# Patient Record
Sex: Female | Born: 1975 | Race: Black or African American | Hispanic: No | Marital: Married
Health system: Midwestern US, Community
[De-identification: ages and names within clinical notes are randomized; demographics above are authoritative.]

## PROBLEM LIST (undated history)

## (undated) DIAGNOSIS — M7061 Trochanteric bursitis, right hip: Secondary | ICD-10-CM

## (undated) DIAGNOSIS — L409 Psoriasis, unspecified: Secondary | ICD-10-CM

## (undated) DIAGNOSIS — E663 Overweight: Secondary | ICD-10-CM

## (undated) DIAGNOSIS — R635 Abnormal weight gain: Secondary | ICD-10-CM

---

## 2013-01-29 NOTE — Progress Notes (Signed)
"  Reviewed record in preparation for visit and have obtained the necessary documentation"  Chief Complaint   Patient presents with   ??? New Patient   ??? PPD Placement   ??? Fatigue       Patient presents in the office today as a new patient establishing care with a new PCP  Patient has past medical hx of depression,psoriosis,and family hx of heart disease and hypertension  Patient recently lost her brother and is having issues with depression  Patient also request PPD placement for dermatologist to continue administering Stelara injections  Patient also has complaint so fatigue and rapid weight gain the past 6 months  Patient given PPD injection in L forearm.  Patient advised to return in 48-72 hours to have PPD read.  PHQ 2 / 9, over the last two weeks 01/29/2013   Little interest or pleasure in doing things Nearly every day   Feeling down, depressed or hopeless Nearly every day   Total Score PHQ 2 6

## 2013-01-29 NOTE — Patient Instructions (Signed)
Adjustment Disorder: After Your Visit  Your Care Instructions  Adjustment disorder means that you have emotional or behavioral problems because of stress. But your response to the stress is far more severe than a normal response. It is severe enough to affect your work or social life and may cause depression and physical pains and problems. Events that may cause this response can include a divorce, money problems, or starting school or a new job. It might be anything that causes some stress.  This disorder is most often a short-term problem. It happens within 3 months of the stressful event or change. If the response lasts longer than 6 months after the event ends, you may have a more serious disorder.  Follow-up care is a key part of your treatment and safety. Be sure to make and go to all appointments, and call your doctor if you are having problems. It's also a good idea to know your test results and keep a list of the medicines you take.  How can you care for yourself at home?  ?? Go to all counseling sessions. Do not skip any because you are feeling better.  ?? If your doctor prescribed medicines, take them exactly as prescribed. Call your doctor if you think you are having a problem with your medicine. You will get more details on the specific medicines your doctor prescribes.  ?? Discuss the causes of your stress with a good friend or family member. Or you can join a support group for people with similar problems. Talking to others sometimes relieves stress.  ?? Get at least 30 minutes of exercise on most days of the week. Walking is a good choice. You also may want to do other activities, such as running, swimming, cycling, or playing tennis or team sports.  Relaxation techniques  Do relaxation exercises 10 to 20 minutes a day. You can play soothing, relaxing music while you do them, if you wish.  ?? Tell others in your house that you are going to do your relaxation exercises. Ask them not to disturb you.  ??  Find a comfortable, quiet place.  ?? Lie down on your back, or sit with your back straight.  ?? Focus on your breathing. Make it slow and steady.  ?? Breathe in through your nose. Breathe out through either your nose or mouth.  ?? Breathe deeply, filling up the area between your navel and your rib cage. Breathe so that your belly goes up and down.  ?? Do not hold your breath.  ?? Breathe like this for 5 to 10 minutes. Notice the feeling of calmness throughout your whole body.  As you continue to breathe slowly and deeply, relax by doing these next steps for another 5 to 10 minutes:  ?? Tighten and relax each muscle group in your body. Start at your toes, and work your way up to your head.  ?? Imagine your muscle groups relaxing and getting heavy.  ?? Empty your mind of all thoughts.  ?? Let yourself relax more and more deeply.  ?? Be aware of the state of calmness that surrounds you.  ?? When your relaxation time is over, you can bring yourself back to alertness by moving your fingers and toes. Then move your hands and feet. And then move your entire body. Sometimes people fall asleep during relaxation. But they most often wake up soon.  ?? Always give yourself time to return to full alertness before you drive a car. Wait to do   anything that might cause an accident if you are not fully alert. Never play a relaxation tape while you drive a car.  When should you call for help?  Call 911 anytime you think you may need emergency care. For example, call if:  ?? You feel you cannot stop from hurting yourself or someone else.  Watch closely for changes in your health, and be sure to contact your doctor if:  ?? You can't go to your counseling sessions.  ?? You do not get better as expected.   Where can you learn more?   Go to http://www.healthwise.net/[facility]  Enter R087 in the search box to learn more about "Adjustment Disorder: After Your Visit."   ?? 2006-2014 Healthwise, Incorporated. Care instructions adapted under license by   [employer] (which disclaims liability or warranty for this information). This care instruction is for use with your licensed healthcare professional. If you have questions about a medical condition or this instruction, always ask your healthcare professional. Healthwise, Incorporated disclaims any warranty or liability for your use of this information.  Content Version: 10.2.346038; Current as of: May 24, 2011

## 2013-01-31 LAB — AMB POC TUBERCULOSIS, INTRADERMAL (SKIN TEST)
PPD: NEGATIVE Negative
mm Induration: 0 mm

## 2013-01-31 NOTE — Telephone Encounter (Signed)
This was done.  PPD neg

## 2013-01-31 NOTE — Telephone Encounter (Signed)
Christine HerrlichFaye from Dr.Phieffer(Dermetology) Office called and will like to receive pt TB results by fax [phone]/ Office # [phone]

## 2013-01-31 NOTE — Addendum Note (Signed)
Addended by: Anise SalvoBRACEY, Wofford Stratton M on: 01/31/2013 10:32 AM     Modules accepted: Orders

## 2013-01-31 NOTE — Progress Notes (Signed)
Subjective:   38 y.o. female for annual physical exam.     Last PAP she thinks was 2 years ago, no history of abnormal and mammogram is due in Oct 2015, no family history of breast cancer.     Her gyne and breast care is done elsewhere by her Ob-Gyne physician but she would like to transfer her care here.  We will need [facility] records to review and determine when next are due.      Specific concerns today:    Patient presents in the office today as a new patient establishing care with a new PCP     Patient has past medical hx of depression,psoriosis,and family hx of heart disease and hypertension   -Patient also request PPD placement for dermatologist to continue administering Stelara injections   -sees derm for psoriasis and needs TB testing today given recommended at start and intermittent while on stelera; will send results to derm; Dr. Freada BergeronLaura Phieffer, MD @ [phone] (fax), she is at [facility] at [phone]    Patient recently lost her brother and is having issues with depression in the last year.    -he died in sleep in June 2014, still having sudden outburst of crying, has not seen counselor. No medication for this.  No history of depression in past  -cousin died from drive by shooting while on vacation, she was from [place]  -multiple family members have died in last 3 years      Patient also has complaint so fatigue and rapid weight gain the past 6 months; she has lost interest in things and no desire to get out and walk, exercise. Correlated to death of brother.           There is no problem list on file for this patient.    There are no active problems to display for this patient.    Current Outpatient Prescriptions   Medication Sig Dispense Refill   ??? fluocinoNIDE (LIDEX) 0.05 % ointment Apply 0.05 Squirts to affected area two (2) times daily as needed.       ??? Ustekinumab (USTEKINAUMAB) 90 mg/mL injection by SubCUTAneous route once.       ??? omeprazole (PRILOSEC) 10 mg capsule Take 10 mg by  mouth daily.       ??? CALCIUM CARBONATE/MAG HYDROX (ROLAIDS PO) Take  by mouth.       ??? calcium carbonate (TUMS) 200 mg calcium (500 mg) chew Take 1 tablet by mouth daily.       ??? levonorgestrel (MIRENA) 20 mcg/24 hr (5 years) IUD 1 each by IntraUTERine route once.         Allergies   Allergen Reactions   ??? Bactrim [Sulfamethoprim Ds] Hives     Past Medical History   Diagnosis Date   ??? Antiphospholipid syndrome    ??? GERD (gastroesophageal reflux disease)    ??? Acid reflux    ??? Contact dermatitis and other eczema, due to unspecified cause    ??? Psoriasis      Past Surgical History   Procedure Laterality Date   ??? Hx wisdom teeth extraction Bilateral    ??? Extraction erupted tooth/exr       Family History   Problem Relation Age of Onset   ??? Heart Disease Mother    ??? Heart Disease Father    ??? Heart Disease Maternal Grandmother    ??? Hypertension Maternal Grandmother    ??? Heart Disease Maternal Grandfather    ??? Hypertension Maternal  Grandfather    ??? Heart Disease Paternal Grandmother    ??? Hypertension Paternal Grandmother    ??? Heart Disease Paternal Grandfather    ??? Diabetes Paternal Grandfather    ??? Hypertension Paternal Grandfather      History   Substance Use Topics   ??? Smoking status: Never Smoker    ??? Smokeless tobacco: Never Used   ??? Alcohol Use: No        No results found for this basename: WBC, WBCLT, [id number], HGBI, HGBPOC, IHGB, HGB, HGBP, WBHGB, HCTI, HCTPOC, IHCT, HCT, PHCT, RBCH, WBHCT, PLT, [id number], [id number], MCV  No results found for this basename: HBA1C, HGBE8, GTF, GLU, GF, GESTF, GTB, GLUCPOC, MCACR, MCA1, MCA2, MCA3, MCA4, UMCA, MCAU, MCARR, LDL, DLDL, LDLC, DLDLP, CRES, CREAI, CREAPOC, ICREA, MCREA, REFC7, ACREA, CREA, REFC3, REFC4   No results found for this basename: CHOL, [id number], CHOLX, CHOLP, CHLST, CHOLV, HDL, [id number], HDLC, HDLP, LDL, DLDL, LDLC, DLDLP, TGL, TGLX, TRIGL, [id number], TRIGP, CHHD, CHHDX  No results found for this basename: GPT, ALT, SGOT, GGT, GGTP, AP, APIT, APX, CBIL, TBIL,  TBILI, [id number]  No results found for this basename: INR, INRI, INRMR, PTMR, PTP, PT1, PT2, INRPOC      No results found for this basename: CGFR, GFRAA, GFRNA, CRCLT, [id number], [id number], CCT, CRES, CREAI, CREAPOC, ICREA, MCREA, REFC7, ACREA, CREA, REFC3, REFC4, BUN, BUNIS, BUNPOC, IBUN, MBUNV, BUNV, INA, NAPOC, NAI, NA, PNA, WBNA, K, IK, KPOCT, KI, PLK, WBK, CLI, CLPOC, ICL, PCL, CL, WBCL, CO2, DIGF, DIG, DIGP, MDIG   No results found for this basename: TSH, TSH2, TSH3, TSHP, TSHELE, T3RIA, TT3, T3U, T3UP, FRT3, FT3, FT4, FT4P, T4, T4P, FT4T, 1164, TT7, TMCAB   No results found for this basename: GLU, GLUCPOC, GLPOC             Review of Systems  A comprehensive review of systems was negative except for that written in the HPI.    Objective:   Blood pressure 117/80, pulse 69, temperature 98.5 ??F (36.9 ??C), temperature source Oral, resp. rate 18, height 5' 1.25" (1.556 m), weight 172 lb 6.4 oz (78.2 kg), SpO2 99.00%.   Physical Examination:   General appearance - alert, well appearing, and in no distress  Eyes - pupils equal and reactive, extraocular eye movements intact  Neck - supple, no significant adenopathy, thyroid exam: thyroid is normal in size without nodules or tenderness  Chest - clear to auscultation, no wheezes, rales or rhonchi, symmetric air entry  Heart - normal rate, regular rhythm, normal S1, S2, no murmurs, rubs, clicks or gallops  Neurological - alert, oriented, normal speech, no focal findings or movement disorder noted; tearful at times.    Assessment/Plan:   Well woman AND additional issues including fatigue which may be depression due to recent death, will rule out other etiology and then if normal will consider SSRI for short time and counseling.       increase physical activity      ICD-9-CM    1. Well adult exam V70.0 CBC WITH AUTOMATED DIFF     LIPID PANEL     METABOLIC PANEL, COMPREHENSIVE     HEMOGLOBIN A1C   2. Medication management V58.69 AMB POC TUBERCULOSIS, INTRADERMAL (SKIN  TEST)   3. Fatigue; DDX: endocrine, metabolic or depression   780.79 TSH, 3RD GENERATION     F/U based on labs    Get [facility] records  Dr. Orpah Melter,  D.O.  Physician

## 2013-02-03 LAB — METABOLIC PANEL, COMPREHENSIVE
A-G Ratio: 1.6 (ref 1.1–2.5)
ALT (SGPT): 10 IU/L (ref 0–32)
AST (SGOT): 16 IU/L (ref 0–40)
Albumin: 4.2 g/dL (ref 3.5–5.5)
Alk. phosphatase: 45 IU/L (ref 39–117)
BUN/Creatinine ratio: 13 (ref 8–20)
BUN: 10 mg/dL (ref 6–20)
Bilirubin, total: 0.3 mg/dL (ref 0.0–1.2)
CO2: 22 mmol/L (ref 18–29)
Calcium: 9.3 mg/dL (ref 8.7–10.2)
Chloride: 100 mmol/L (ref 97–108)
Creatinine: 0.78 mg/dL (ref 0.57–1.00)
GFR est AA: 111 mL/min/{1.73_m2} (ref >59)
GFR est non-AA: 96 mL/min/{1.73_m2} (ref >59)
GLOBULIN, TOTAL: 2.6 g/dL (ref 1.5–4.5)
Glucose: 86 mg/dL (ref 65–99)
Potassium: 4.7 mmol/L (ref 3.5–5.2)
Protein, total: 6.8 g/dL (ref 6.0–8.5)
Sodium: 137 mmol/L (ref 134–144)

## 2013-02-03 LAB — LIPID PANEL
Cholesterol, total: 186 mg/dL (ref 100–199)
HDL Cholesterol: 56 mg/dL (ref >39)
LDL, calculated: 116 mg/dL — ABNORMAL HIGH (ref 0–99)
Triglyceride: 68 mg/dL (ref 0–149)
VLDL, calculated: 14 mg/dL (ref 5–40)

## 2013-02-03 LAB — CBC WITH AUTOMATED DIFF
ABS. BASOPHILS: 0 10*3/uL (ref 0.0–0.2)
ABS. EOSINOPHILS: 0.2 10*3/uL (ref 0.0–0.4)
ABS. IMM. GRANS.: 0 10*3/uL (ref 0.0–0.1)
ABS. MONOCYTES: 0.6 10*3/uL (ref 0.1–0.9)
ABS. NEUTROPHILS: 4.3 10*3/uL (ref 1.4–7.0)
Abs Lymphocytes: 2.5 10*3/uL (ref 0.7–3.1)
BASOPHILS: 1 %
EOSINOPHILS: 3 %
HCT: 42.2 % (ref 34.0–46.6)
HGB: 14.1 g/dL (ref 11.1–15.9)
IMMATURE GRANULOCYTES: 0 %
Lymphocytes: 32 %
MCH: 30.5 pg (ref 26.6–33.0)
MCHC: 33.4 g/dL (ref 31.5–35.7)
MCV: 91 fL (ref 79–97)
MONOCYTES: 8 %
NEUTROPHILS: 56 %
PLATELET: 334 10*3/uL (ref 150–379)
RBC: 4.62 x10E6/uL (ref 3.77–5.28)
RDW: 13 % (ref 12.3–15.4)
WBC: 7.7 10*3/uL (ref 3.4–10.8)

## 2013-02-03 LAB — HEMOGLOBIN A1C WITH EAG: Hemoglobin A1c: 5.8 % — ABNORMAL HIGH (ref 4.8–5.6)

## 2013-02-03 LAB — TSH 3RD GENERATION: TSH: 0.879 u[IU]/mL (ref 0.450–4.500)

## 2013-02-03 LAB — CVD REPORT: PDF IMAGE: 0

## 2013-02-04 NOTE — Telephone Encounter (Signed)
Saye from Dr.Phier office will like to receive pt PPD test results by fax, please fax to [phone], please fax document asap.  Lanier EnsignSaye can be reached at [phone]

## 2013-02-05 NOTE — Telephone Encounter (Signed)
Faxed labs and PPD reading to MD

## 2013-02-20 NOTE — Progress Notes (Signed)
Outbound call made to patient to schedule prediabetic education per Dr. Tawni PummelVarney's request. Unable to reach; left voicemail to return NN's phone call.    Patient returning NN's phone call--scheduled NN appointment on 02/21/13 at 4:00pm.

## 2013-02-21 NOTE — Progress Notes (Signed)
NN appointment today, 02/21/13, for prediabetic education.    NN instructed patient on prediabetes disease process and pathophysiology. NN instructed patient on the significance of HgbA1C lab test and the importance of having it drawn every 3-6 months. Patient's most recent HgbA1C 5.8%. Patient is aware that HgbA1C progressing to 6.5% would be considered diabetes.    NN reviewed diabetic plate method with patient. NN reviewed that half of the plate should consist of non-starchy vegetables, while the other half of the plate should be divided into two quarters--one quarter starch and one quarter protein. NN reviewed examples of starchy vegetables (corn, potatoes, lima beans, sweet potatoes, acorn squash, green peas). NN reviewed appropriate portion sizes, such as a lean source of protein should be about the size of a deck of cards. NN instructed patient on diabetic meal plans and food suggestions for breakfast, lunch, & dinner. NN instructed patient on the importance of eating 3 meals a day with 1 snack, and spacing these meals out every 4 to 5 hours for best glycemic control. NN reviewed ideas of good diabetic snacks such as 6 saltine crackers with cheese, handful of almonds, small yogurt, or string cheese & small apple. NN provided patient with diabetic plate method handouts. NN suggested patient "meal prep" one day a week for the entire week, to include measuring portion sizes and preparing meals in individual tupperware containers for the week. Patient voiced understanding and found this to be helpful with her work schedule.    NN instructed patient on the importance of incorporating exercise into her lifestyle. NN suggested patient walk 2-3 times per week for 30 minutes, and increase in frequency and duration as tolerated. Patient reports she had a treadmill at home; she states she will try to start exercising in the morning prior to work as she is often too tired at the end of the day.     NN provided patient  with contact information for any further questions or concerns. Reviewed plan of care. Patient has provided input to plan and agrees with goals.

## 2013-03-21 MED ORDER — PHENTERMINE 37.5 MG TAB
37.5 mg | ORAL_TABLET | ORAL | Status: DC
Start: 2013-03-21 — End: 2013-04-17

## 2013-03-21 NOTE — Patient Instructions (Signed)
Phentermine (By mouth)   Phentermine (FEN-ter-meen)  Helps you lose weight when used for a short time.   Brand Name(s):Adipex-P, Phentercot   There may be other brand names for this medicine.  When This Medicine Should Not Be Used:   Do not use this medicine if you have had an allergic reaction to phentermine or similar medicines (such as adrenaline, amphetamine, dopamine, dobutamine, ephedrine, or lisdexamfetamine). Do not use this medicine if you are pregnant or breastfeeding. Do not use this medicine if you are normally nervous and tense or if you have glaucoma, an overactive thyroid, heart or blood vessel disease, heart rhythm problems, uncontrolled high blood pressure, or a history of stroke or drug abuse. Do not use this medicine if you also take an MAO inhibitor (MAOI), such as isocarboxazid, phenelzine, selegiline, tranylcypromine, Eldepryl??, Marplan??, Nardil??, or Parnate??, or if you have taken an MAOI within the past 14 days.  How to Use This Medicine:   Dissolving Tablet, Capsule, Long Acting Capsule, Tablet, Dissolving Tablet  ?? Take your medicine as directed.  ?? This medicine is not for long-term use.  ?? To avoid trouble sleeping, always take this medicine in the morning and never at bedtime or late in the evening.   ?? Take the tablet before breakfast or 1 to 2 hours after breakfast.  ?? Take the disintegrating tablet in the morning, with or without food.  ?? Take the capsule 2 hours after breakfast.  ?? Take the extended-release capsule before breakfast.  ?? Swallow the extended-release capsule whole. Do not crush, break, or chew it.  ?? If you are using the disintegrating tablet, make sure your hands are dry before you handle the tablet. Place the tablet on your tongue. It should melt quickly. After the tablet has melted, swallow or take a drink of water.  ?? Carefully follow your doctor's instructions about any special diet.  If a dose is missed:   ?? Take a dose as soon as you remember. If it is almost time  for your next dose, wait until then and take a regular dose. Do not take extra medicine to make up for a missed dose.  How to Store and Dispose of This Medicine:   ?? Store the medicine in a closed container at room temperature, away from heat, moisture, and direct light.  ?? Ask your pharmacist, doctor, or health caregiver about the best way to dispose of any outdated medicine or medicine no longer needed.  ?? Keep all medicine out of the reach of children. Never share your medicine with anyone.  Drugs and Foods to Avoid:   Ask your doctor or pharmacist before using any other medicine, including over-the-counter medicines, vitamins, and herbal products.  ?? Make sure your doctor knows if you also use amphetamines (such as dextroamphetamine, methamphetamine, Dexedrine??, Desoxyn??) or other diet pills. Tell your doctor if you use insulin or diabetes medicine that you take by mouth (such as glimepiride, glipizide, glyburide, metformin, Actos??, Actoplus Met??, Janumet??, Januvia??, Prandin??) or medicine for depression (such as fluoxetine, fluvoxamine, paroxetine, sertraline, Luvox??, Paxil??, Prozac??, Zoloft??).  ?? Do not drink alcohol while you are using this medicine.  Warnings While Using This Medicine:   ?? It is not safe to take this medicine during pregnancy. It could harm an unborn baby. Tell your doctor right away if you become pregnant.  ?? Make sure your doctor knows if you have kidney disease, diabetes, heart disease, or high blood pressure. Tell your doctor if you are   allergic to tartrazine or aspirin.  ?? Check with your doctor right away if you develop shortness of breath, chest pain, fainting, or swelling of the lower legs, ankles, or feet. These may be symptoms of a serious lung disease.  ?? This medicine can be habit-forming. Do not use more than your prescribed dose. Call your doctor if you think your medicine is not working.  ?? Do not stop using this medicine suddenly. Your doctor will need to slowly decrease your  dose before you stop it completely.  ?? This medicine may make you dizzy or drowsy. Do not drive, use machines, or do anything else that could be dangerous until you know how this medicine affects you.  ?? This medicine may affect blood sugar levels in patients with diabetes. Check with your doctor if you have any questions.  Possible Side Effects While Using This Medicine:   Call your doctor right away if you notice any of these side effects:  ?? Allergic reaction: Itching or hives, swelling in your face or hands, swelling or tingling in your mouth or throat, chest tightness, trouble breathing  ?? Fast, slow, pounding, or uneven heartbeat  ?? Seizures or tremors  ?? Severe headache  ?? Shortness of breath  ?? Swelling of your feet or lower legs  If you notice these less serious side effects, talk with your doctor:   ?? Changes in sex drive  ?? Dizziness, drowsiness, mild headache  ?? Dry mouth or a bad taste in your mouth  ?? Nausea, vomiting, diarrhea, constipation, stomach cramps  ?? Restlessness or nervousness  ?? Trouble sleeping  If you notice other side effects that you think are caused by this medicine, tell your doctor.   Call your doctor for medical advice about side effects. You may report side effects to FDA at 1-800-FDA-1088  ?? 2014 [place] is for End User's use only and may not be sold, redistributed or otherwise used for commercial purposes.  The above information is an educational aid only. It is not intended as medical advice for individual conditions or treatments. Talk to your doctor, nurse or pharmacist before following any medical regimen to see if it is safe and effective for you.    Learning About Dietary Guidelines  What are the Dietary Guidelines for Americans?     Dietary Guidelines for Americans provide tips for eating well and staying healthy. This helps reduce the risk for long-term (chronic) diseases.  These adult guidelines from the [employer] recommend  that you:  ?? Eat lots of fruits, vegetables, whole grains, and low-fat or nonfat dairy products.  ?? Try to balance your eating with your activity. This helps you stay at a healthy weight.  ?? Drink alcohol in moderation, if at all.  ?? Limit foods high in salt, saturated fat, trans fat, cholesterol, and added sugar.  What is MyPlate?  MyPlate is the U.[place] government's food guide. It can help you make your own well-balanced eating plan. A balanced eating plan means that you eat enough, but not too much, and that your food gives you the nutrients you need to stay healthy.  MyPlate focuses on eating plenty of whole grains, fruits, and vegetables, and on limiting fat and sugar. It is available online at www.[employer]Closeouts.hu.  How can you get started?  MyPlate suggests that most adults eat certain amounts from the different food groups:  Grains  Eat 5 to 8 ounces of grains each day. Half of those  should be whole grains. Choose whole-grain breads, cold and cooked cereals and grains, pasta (without creamy sauces), hard rolls, or low-fat or fat-free crackers.  Vegetables  Eat 2 to 3 cups of vegetables every day. They contain little if any fat. And they have lots of nutrients that help protect against heart disease.  Fruits  Eat 1?? to 2 cups of fruits every day. Fruits contain very little fat but lots of nutrients.  Protein foods  Most adults need 5 to 6?? ounces each day. Choose fish and lean poultry more often. Eat red meat and fried meats less often. Dried beans, tofu, and nuts are also good sources of protein.  Dairy  Most adults need 3 cups of milk and milk products a day. Choose low-fat or fat-free products from this food group. If you have problems digesting milk, try eating cheese or yogurt instead.  Limit fats and oils, including those used in cooking. When you do use fats, choose oils that are liquid at room temperature (unsaturated fats). These include canola oil and olive oil. Avoid foods with trans fats, such as  many fried foods, cookies, and snack foods.   Where can you learn more?   Go to [employer].cy  Enter [phone] in the search box to learn more about "Learning About Dietary Guidelines."   ?? 2006-2015 Healthwise, Incorporated. Care instructions adapted under license by [employer] (which disclaims liability or warranty for this information). This care instruction is for use with your licensed healthcare professional. If you have questions about a medical condition or this instruction, always ask your healthcare professional. [place] any warranty or liability for your use of this information.  Content Version: 10.4.390249; Current as of: November 15, 2012

## 2013-03-21 NOTE — Progress Notes (Signed)
Subjective:     Christine York is a 38 y.o. female seen for new onset of glucose intolerance found on 01/29/13 with A1C of 5.8%    She met with Nurse navigator and discussed diet changes and found information helpful.  She has been drinking green smoothies but has not lost weight.  Exercised every day last week but no energy this week and did not exercise.     She is feeling tired.  Recent TSH was normal.  CMP was normal.      She had lipid LDL of 114 but otherwise normal cholesterol panel.     There has been some question of depression in the past due to her brother's passing but her mood has been stable and she is feeling well.     She has no history of substance or alcohol misuse or abuse    She is not using alcohol or rec drugs.       Diabetic Review of Systems: no polyuria or polydipsia, no chest pain, dyspnea or TIA's, no numbness, tingling or pain in extremities, weight has increased.    Diet and Lifestyle: not attempting to follow a low fat, low cholesterol diet, does not rigorously follow a diabetic diet, exercises sporadically, nonsmoker, alcohol intake none.  Home BP Monitoring: not measured at home but normal in office visit       Patient Active Problem List   Diagnosis Code   ??? Glucose intolerance (impaired glucose tolerance) 790.22     Patient Active Problem List    Diagnosis Date Noted   ??? Glucose intolerance (impaired glucose tolerance) 03/21/2013     Current Outpatient Prescriptions   Medication Sig Dispense Refill   ??? fluocinoNIDE (LIDEX) 0.05 % ointment Apply 0.05 Squirts to affected area two (2) times daily as needed.       ??? Ustekinumab (USTEKINAUMAB) 90 mg/mL injection by SubCUTAneous route once.       ??? CALCIUM CARBONATE/MAG HYDROX (ROLAIDS PO) Take  by mouth.       ??? calcium carbonate (TUMS) 200 mg calcium (500 mg) chew Take 1 tablet by mouth daily.       ??? levonorgestrel (MIRENA) 20 mcg/24 hr (5 years) IUD 1 each by IntraUTERine route once.         Allergies   Allergen Reactions   ??? Bactrim  [Sulfamethoprim Ds] Hives     Past Medical History   Diagnosis Date   ??? Antiphospholipid syndrome    ??? GERD (gastroesophageal reflux disease)    ??? Acid reflux    ??? Contact dermatitis and other eczema, due to unspecified cause    ??? Psoriasis      Past Surgical History   Procedure Laterality Date   ??? Hx wisdom teeth extraction Bilateral    ??? Extraction erupted tooth/exr       Family History   Problem Relation Age of Onset   ??? Heart Disease Mother    ??? Heart Disease Father    ??? Heart Disease Maternal Grandmother    ??? Hypertension Maternal Grandmother    ??? Heart Disease Maternal Grandfather    ??? Hypertension Maternal Grandfather    ??? Heart Disease Paternal Grandmother    ??? Hypertension Paternal Grandmother    ??? Heart Disease Paternal Grandfather    ??? Diabetes Paternal Grandfather    ??? Hypertension Paternal Grandfather      History   Substance Use Topics   ??? Smoking status: Never Smoker    ??? Smokeless tobacco:  Never Used   ??? Alcohol Use: No          Component Value Date/Time   Hemoglobin A1c 5.8 02/01/2013  8:09 AM   Glucose 86 02/01/2013  8:09 AM   LDL, calculated 116 02/01/2013  8:09 AM   Creatinine 0.78 02/01/2013  8:09 AM        Component Value Date/Time   Cholesterol, total 186 02/01/2013  8:09 AM   HDL Cholesterol 56 02/01/2013  8:09 AM   LDL, calculated 116 02/01/2013  8:09 AM   Triglyceride 68 02/01/2013  8:09 AM       Component Value Date/Time   GFR est AA 111 02/01/2013  8:09 AM   GFR est non-AA 96 02/01/2013  8:09 AM   Creatinine 0.78 02/01/2013  8:09 AM   BUN 10 02/01/2013  8:09 AM   Sodium 137 02/01/2013  8:09 AM   Potassium 4.7 02/01/2013  8:09 AM   Chloride 100 02/01/2013  8:09 AM   CO2 22 02/01/2013  8:09 AM        Component Value Date/Time   TSH 0.879 02/01/2013  8:09 AM         Review of Systems  A comprehensive review of systems was negative except for that written in the HPI.    Objective:     BP 115/78    Pulse 65    Temp(Src) 98.2 ??F (36.8 ??C) (Oral)    Resp 18    Ht 5' 1.25" (1.556 m)    Wt 173 lb 6.4 oz (78.654  kg)    BMI 32.49 kg/m2      SpO2 98%   Appearance: alert, well appearing, and in no distress.  Exam: Extrem:no edema  Lungs: CTA bilat  CV: normal rate, normal BP     Lab review: labs reviewed, I note that glycosylated hemoglobin abnormal at 5.8%.    Assessment/Plan:     New onset Glucose intolerance , needs further management    Diabetic issues reviewed with her: diabetic diet discussed in detail, written exchange diet given, low cholesterol diet, weight control and daily exercise discussed, glycohemoglobin and other lab monitoring discussed and long term diabetic complications discussed.       ICD-9-CM    1. Glucose intolerance (impaired glucose tolerance) 790.22    2. Abnormal weight gain 783.1 phentermine (ADIPEX-P) 37.5 mg tablet PO daily, see below for more details.      3. Fatigue 780.79      2. Abnormal weight gain,.  EDUCATION:  Discussed following with patient, that below can be used for weight loss to occur:    -Exercise, discussed FIT principal for training.  Discussed that on average only 2% of weight loss is contributed to exercise but still must be done given that those who kept weight off after losing, after 2 years, exercised the most.  Goal exercise is 60 minutes MOST days of week (5-7 days per week) moderate intensity.   -Behavioral changes (referral to dietician today)  -Diet modification  -Medications; discussed action, most common side effects of the following FDA approved medications for weight loss.  Orlistat, Phentermine-Topirimate ER, Locaserin, phentermine and the non FDA approved for weight loss, metformin.  Discussed average weight loss with each. Discussed seratonin syndrome and how NO documented cases with phentermine and SSRI use and that this is a theoretical risk.  Reviewed signs of seratonin syndrome and gave hand out on these side effects to watch for.      Based on above,  patient would like to trial:   phentermine (ADIPEX-P) 37.5 mg tablet Take 1 Tab by mouth every morning. Max  Daily Amount: 37.5 mg.  30 Tab       Goal weight loss for next month (4 weeks); 4-6 lbs    Discussed that this medication can be used for >3 months (although it is only FDA approved for 3 months, many medications are used in off label use under strict supervision of physician) but she will need to demonstrate weight loss EVERY month in order to continue.      Goal weight: BMI at or less than 25    Discussed risk of obesity and BMI >30 and risk factors for other diseases if weight loss not achieved.     Discussed cost of medication with patient     Labs: urine drug screen not indicated but may consider if any aberrant behavior in future to monitor use of this medication.      Reviewed patient's previous A1C, Lipid, TSH, CMP and CBC     Patient stated that she WILL NOT share this medication with anyone.       Follow-up Disposition:  Return in about 4 weeks (around 04/18/2013) for new medication start.         Dr. Dione Plover,  D.O.  Physician

## 2013-03-21 NOTE — Progress Notes (Signed)
"  Reviewed record in preparation for visit and have obtained the necessary documentation"  Chief Complaint   Patient presents with   ??? Abnormal Lab Results     follow up A1C     Patient presents in the office today for a follow up of abnormal A1C results showing prediabetes   1. Have you been to the ER, urgent care clinic since your last visit?  Hospitalized since your last visit?No    2. Have you seen or consulted any other health care providers outside of the [facility] since your last visit?  Include any pap smears or colon screening. No

## 2013-03-26 NOTE — Progress Notes (Signed)
Adherence plan episode for prediabetes resolved; HgbA1C <8%.

## 2013-04-17 MED ORDER — PHENTERMINE 37.5 MG TAB
37.5 mg | ORAL_TABLET | ORAL | Status: DC
Start: 2013-04-17 — End: 2013-05-21

## 2013-04-17 NOTE — Progress Notes (Signed)
"  Reviewed record in preparation for visit and have obtained the necessary documentation"  Chief Complaint   Patient presents with   ??? Weight Management     follow up      Patient presents in the office today for a weight management follow up since starting Phentermine   1. Have you been to the ER, urgent care clinic since your last visit?  Hospitalized since your last visit?No    2. Have you seen or consulted any other health care providers outside of the [facility] since your last visit?  Include any pap smears or colon screening. No

## 2013-04-17 NOTE — Patient Instructions (Signed)
Learning About Dietary Guidelines  What are the Dietary Guidelines for Americans?     Dietary Guidelines for Americans provide tips for eating well and staying healthy. This helps reduce the risk for long-term (chronic) diseases.  These adult guidelines from the [place] government recommend that you:  ?? Eat lots of fruits, vegetables, whole grains, and low-fat or nonfat dairy products.  ?? Try to balance your eating with your activity. This helps you stay at a healthy weight.  ?? Drink alcohol in moderation, if at all.  ?? Limit foods high in salt, saturated fat, trans fat, cholesterol, and added sugar.  What is MyPlate?  MyPlate is the U.[place] government's food guide. It can help you make your own well-balanced eating plan. A balanced eating plan means that you eat enough, but not too much, and that your food gives you the nutrients you need to stay healthy.  MyPlate focuses on eating plenty of whole grains, fruits, and vegetables, and on limiting fat and sugar. It is available online at www.ChooseMyPlate.gov.  How can you get started?  MyPlate suggests that most adults eat certain amounts from the different food groups:  Grains  Eat 5 to 8 ounces of grains each day. Half of those should be whole grains. Choose whole-grain breads, cold and cooked cereals and grains, pasta (without creamy sauces), hard rolls, or low-fat or fat-free crackers.  Vegetables  Eat 2 to 3 cups of vegetables every day. They contain little if any fat. And they have lots of nutrients that help protect against heart disease.  Fruits  Eat 1?? to 2 cups of fruits every day. Fruits contain very little fat but lots of nutrients.  Protein foods  Most adults need 5 to 6?? ounces each day. Choose fish and lean poultry more often. Eat red meat and fried meats less often. Dried beans, tofu, and nuts are also good sources of protein.  Dairy  Most adults need 3 cups of milk and milk products a day. Choose low-fat or fat-free products from this food  group. If you have problems digesting milk, try eating cheese or yogurt instead.  Limit fats and oils, including those used in cooking. When you do use fats, choose oils that are liquid at room temperature (unsaturated fats). These include canola oil and olive oil. Avoid foods with trans fats, such as many fried foods, cookies, and snack foods.   Where can you learn more?   Go to http://www.healthwise.net/[facility]  Enter D676 in the search box to learn more about "Learning About Dietary Guidelines."   ?? 2006-2015 Healthwise, Incorporated. Care instructions adapted under license by [employer] (which disclaims liability or warranty for this information). This care instruction is for use with your licensed healthcare professional. If you have questions about a medical condition or this instruction, always ask your healthcare professional. Healthwise, Incorporated disclaims any warranty or liability for your use of this information.  Content Version: 10.4.390249; Current as of: November 15, 2012

## 2013-04-24 NOTE — Progress Notes (Signed)
CC: follow up to abnormal weight gain    Started phentermine 37.5 mg PO daily, 4 weeks ago.  Doing well.  Lost 12#.  Eating better. Making better choices on food.  Doing activity daily, light, not hard core moderate intensity daily yet as she is getting use to medication    Denies adverse S/E from medication, no agitation, no mood change, no edema, no CP, SOB, no difficulty sleeping.  No change in urine or bowel habits.       Wt Readings from Last 3 Encounters:   04/17/13 161 lb (73.029 kg)   03/21/13 173 lb 6.4 oz (78.654 kg)   01/29/13 172 lb 6.4 oz (78.2 kg)     Ht Readings from Last 3 Encounters:   04/17/13 5' 1.25" (1.556 m)   03/21/13 5' 1.25" (1.556 m)   01/29/13 5' 1.25" (1.556 m)         12 point ROS reviewed and otherwise negative unless stated above.     Past Medical History   Diagnosis Date   ??? Antiphospholipid syndrome (HCC)    ??? GERD (gastroesophageal reflux disease)    ??? Acid reflux    ??? Contact dermatitis and other eczema, due to unspecified cause    ??? Psoriasis    ??? Pregnancy      [id number]; 4 SAB and 1 C/S       Current Outpatient Prescriptions   Medication Sig Dispense Refill   ??? phentermine (ADIPEX-P) 37.5 mg tablet Take 1 Tab by mouth every morning. Max Daily Amount: 37.5 mg.  30 Tab  0   ??? fluocinoNIDE (LIDEX) 0.05 % ointment Apply 0.05 Squirts to affected area two (2) times daily as needed.       ??? Ustekinumab (USTEKINAUMAB) 90 mg/mL injection by SubCUTAneous route once.       ??? CALCIUM CARBONATE/MAG HYDROX (ROLAIDS PO) Take  by mouth.       ??? calcium carbonate (TUMS) 200 mg calcium (500 mg) chew Take 1 tablet by mouth daily.       ??? levonorgestrel (MIRENA) 20 mcg/24 hr (5 years) IUD 1 each by IntraUTERine route once.           Allergies   Allergen Reactions   ??? Bactrim [Sulfamethoprim Ds] Hives       History     Social History   ??? Marital Status: MARRIED     Spouse Name: N/A     Number of Children: N/A   ??? Years of Education: N/A     Occupational History   ??? Not on file.     Social History Main  Topics   ??? Smoking status: Never Smoker    ??? Smokeless tobacco: Never Used   ??? Alcohol Use: No   ??? Drug Use: No   ??? Sexual Activity:     Partners: Male     Pharmacist, hospitalBirth Control/ Protection: IUD     Other Topics Concern   ??? Not on file     Social History Narrative         OBJECTIVE:  Filed Vitals:    04/17/13 1513   BP: 115/80   Pulse: 89   Temp: 98.4 ??F (36.9 ??C)   TempSrc: Oral   Resp: 18   Height: 5' 1.25" (1.556 m)   Weight: 161 lb (73.029 kg)   SpO2: 99%       Gen: NAD  CV: S1, S2 present, no M/R/G  Pulm: CTA bilat  Extrem: no edema  A/P:  1. Abnormal weight gain  -doing well, encouraged today  - phentermine (ADIPEX-P) 37.5 mg tablet; Take 1 Tab by mouth every morning. Max Daily Amount: 37.5 mg.  Dispense: 30 Tab; Refill: 0    2. Glucose intolerance (impaired glucose tolerance  -as above, discussed that weight loss is best way to reduce chance of progressing to diabetes.     Appropriate weight loss without adverse S/E.   Recommended and appropriate to continue medication for additional month.     Follow-up Disposition:  Return in about 4 weeks (around 05/15/2013) for medication management and PAP.      Dr. Orpah Melteratherine Deaken Jurgens,  D.O.  Physician

## 2013-05-21 MED ORDER — PHENTERMINE 37.5 MG TAB
37.5 mg | ORAL_TABLET | ORAL | Status: DC
Start: 2013-05-21 — End: 2013-10-27

## 2013-05-21 NOTE — Progress Notes (Signed)
Pt presents to office today for a pap smear and states she was supposed to have her A1C labs drawn today but she is not fasting. Pt saw her dermatologist 3 weeks and was a cream to fade the dark spot on her face away. Not sure about cream name though, will let us know on her next visit  Chief Complaint   Patient presents with   ??? Well Woman     Pap smear     1. Have you been to the ER, urgent care clinic since your last visit?  Hospitalized since your last visit?No    2. Have you seen or consulted any other health care providers outside of the [facility] since your last visit?  Include any pap smears or colon screening. No

## 2013-05-21 NOTE — Patient Instructions (Signed)
Learning About Dietary Guidelines  What are the Dietary Guidelines for Americans?     Dietary Guidelines for Americans provide tips for eating well and staying healthy. This helps reduce the risk for long-term (chronic) diseases.  These adult guidelines from the [place] government recommend that you:  ?? Eat lots of fruits, vegetables, whole grains, and low-fat or nonfat dairy products.  ?? Try to balance your eating with your activity. This helps you stay at a healthy weight.  ?? Drink alcohol in moderation, if at all.  ?? Limit foods high in salt, saturated fat, trans fat, cholesterol, and added sugar.  What is MyPlate?  MyPlate is the [place] government's food guide. It can help you make your own well-balanced eating plan. A balanced eating plan means that you eat enough, but not too much, and that your food gives you the nutrients you need to stay healthy.  MyPlate focuses on eating plenty of whole grains, fruits, and vegetables, and on limiting fat and sugar. It is available online at www.ChooseMyPlate.gov.  How can you get started?  MyPlate suggests that most adults eat certain amounts from the different food groups:  Grains  Eat 5 to 8 ounces of grains each day. Half of those should be whole grains. Choose whole-grain breads, cold and cooked cereals and grains, pasta (without creamy sauces), hard rolls, or low-fat or fat-free crackers.  Vegetables  Eat 2 to 3 cups of vegetables every day. They contain little if any fat. And they have lots of nutrients that help protect against heart disease.  Fruits  Eat 1?? to 2 cups of fruits every day. Fruits contain very little fat but lots of nutrients.  Protein foods  Most adults need 5 to 6?? ounces each day. Choose fish and lean poultry more often. Eat red meat and fried meats less often. Dried beans, tofu, and nuts are also good sources of protein.  Dairy  Most adults need 3 cups of milk and milk products a day. Choose low-fat or fat-free products from this food  group. If you have problems digesting milk, try eating cheese or yogurt instead.  Limit fats and oils, including those used in cooking. When you do use fats, choose oils that are liquid at room temperature (unsaturated fats). These include canola oil and olive oil. Avoid foods with trans fats, such as many fried foods, cookies, and snack foods.   Where can you learn more?   Go to http://www.healthwise.net/[facility]  Enter D676 in the search box to learn more about "Learning About Dietary Guidelines."   ?? 2006-2015 Healthwise, Incorporated. Care instructions adapted under license by [facility] (which disclaims liability or warranty for this information). This care instruction is for use with your licensed healthcare professional. If you have questions about a medical condition or this instruction, always ask your healthcare professional. Healthwise, Incorporated disclaims any warranty or liability for your use of this information.  Content Version: 10.4.390249; Current as of: November 15, 2012

## 2013-05-21 NOTE — Progress Notes (Signed)
CC: follow up to abnormal weight gain    Started phentermine 37.5 mg PO daily, on 03/21/13 after other etiology for weight gain was ruled out.      Recent labs:   Recent TSH was normal.   CMP was normal.   She had lipid LDL of 114 but otherwise normal cholesterol panel.   A1C on 01/29/13 showed A1C of 5.8%, new glucose intolerance      Doing well.  Has lost total of 20 lbs.      Start weight=173  Current weight=153  Current BMI:  Body mass index is 28.66 kg/(m^2).      Eating better. Making better choices on food.  Has started moderate intensity daily in last 4 weeks.      Denies adverse S/E from medication, no agitation, no mood change, no edema, no CP, SOB, no difficulty sleeping.  No change in urine or bowel habits.   No changes in sexual function.     Has mild dry mouth and constipation but managed with water and fiber respectively.       Wt Readings from Last 3 Encounters:   05/21/13 153 lb (69.4 kg)   04/17/13 161 lb (73.029 kg)   03/21/13 173 lb 6.4 oz (78.654 kg)     Ht Readings from Last 3 Encounters:   05/21/13 5' 1.25" (1.556 m)   04/17/13 5' 1.25" (1.556 m)   03/21/13 5' 1.25" (1.556 m)         12 point ROS reviewed and otherwise negative unless stated above.     PREVENTATIVE CARE:  Due for PAP smear check today    Has IUD in place    Gyn ROS: normal menses, no abnormal bleeding, pelvic pain or discharge, no breast pain or new or enlarging lumps on self exam  Genito-Urinary ROS: negative for - change in menstrual cycle, change in urinary stream, dysmenorrhea, dyspareunia, dysuria, genital discharge, genital ulcers, hematuria, incontinence, irregular/heavy menses, nocturia, pelvic pain, urinary frequency/urgency or vulvar/vaginal symptoms          Past Medical History   Diagnosis Date   ??? Antiphospholipid syndrome (HCC)    ??? GERD (gastroesophageal reflux disease)    ??? Acid reflux    ??? Contact dermatitis and other eczema, due to unspecified cause    ??? Psoriasis    ??? Pregnancy      [id number]; 4 SAB and 1 C/S        Current Outpatient Prescriptions   Medication Sig Dispense Refill   ??? fexofenadine (ALLEGRA) 60 mg tablet Take 60 mg by mouth daily. Takes 1 tab daily       ??? phentermine (ADIPEX-P) 37.5 mg tablet Take 1 Tab by mouth every morning. Max Daily Amount: 37.5 mg.  30 Tab  2   ??? fluocinoNIDE (LIDEX) 0.05 % ointment Apply 0.05 Squirts to affected area two (2) times daily as needed.       ??? Ustekinumab (USTEKINAUMAB) 90 mg/mL injection by SubCUTAneous route once.       ??? levonorgestrel (MIRENA) 20 mcg/24 hr (5 years) IUD 1 each by IntraUTERine route once.           Allergies   Allergen Reactions   ??? Bactrim [Sulfamethoprim Ds] Hives       History     Social History   ??? Marital Status: MARRIED     Spouse Name: N/A     Number of Children: N/A   ??? Years of Education: N/A  Occupational History   ??? Not on file.     Social History Main Topics   ??? Smoking status: Never Smoker    ??? Smokeless tobacco: Never Used   ??? Alcohol Use: No   ??? Drug Use: No   ??? Sexual Activity:     Partners: Male     Pharmacist, hospitalBirth Control/ Protection: IUD     Other Topics Concern   ??? Not on file     Social History Narrative         OBJECTIVE:  BP 105/74   Pulse 80   Temp(Src) 98.1 ??F (36.7 ??C) (Oral)   Resp 16   Ht 5' 1.25" (1.556 m)   Wt 153 lb (69.4 kg)   BMI 28.66 kg/m2   SpO2 100%    General appearance  alert, cooperative, no distress, appears stated age   Head  Normocephalic, without obvious abnormality, atraumatic   Eyes  conjunctivae/corneas clear. PERRL, EOM's intact.   Heart  regular rate ; BP well controlled    Extremities extremities normal, atraumatic, no cyanosis or edema   GU No lesions, rash or discharge in external GU area.  Vagina without lesions, tissues appear moist, without irregularity.  Cervix is multiparous with IUD strings present, no abnormal discharge or bleeding.      PAP Collected today         A/P:    ICD-9-CM    1. Glucose intolerance (impaired glucose tolerance) 790.22 See below for plan   2. Abnormal weight gain 783.1 Patient  with continued success with weight loss medication.  She is not yet at goal BMI    PMP without aberrant behavior.  Low risk for abuse.     No adverse S/E that would indicate need to stop medication at this point    Continue medication until BMI <25.  She understands that using this medication is off label for >3 months but is common practice among [employer] physicians to continue this medication as long as HR, BP well controlled and no adverse S/E or aberrant drug behavior.  She meets qualification to continue this medication.     OK to fill this medication monthly while I am out of maternity leave, do not stop abruptly as pt could have adverse withdrawal effects.      F/U with me in September 2015 to evaluate response.      phentermine (ADIPEX-P) 37.5 mg tablet PO daily        3. Screening for cervical cancer V76.2 PAP (IMAGE GUIDED) + HPV HIGH RISK      Send letter with results.     Repeat 5 years based on current literature if HPV negative and normal cellular appearance.          .     Follow-up Disposition:  Return in about 3 months (around 09/02/2013) for weight follow up.      Dr. Orpah Melteratherine Brette Cast,  D.O.  Physician

## 2013-05-22 NOTE — Addendum Note (Signed)
Addended by: Alphonsus Sias on: 05/22/2013 02:41 PM     Modules accepted: Level of Service

## 2013-05-23 LAB — PAP (IMAGE GUIDED) + HPV HIGH RISK
.: 0
HPV DNA Probe, High Risk: NEGATIVE
HPV, High Risk: NEGATIVE
LABCORP 019018: 0

## 2013-09-03 LAB — AMB POC HEMOGLOBIN A1C: Hemoglobin A1c (POC): 5.6 %

## 2013-09-03 NOTE — Progress Notes (Signed)
CC: follow up to glucose intolerance and abnormal weight gain and NEW problem of tachycardia on intake    Problems 1 and 2:    Glucose intolerance and abnormal weight gain.       Started phentermine 37.5 mg PO daily, on 03/21/13 after other etiology for weight gain was ruled out.      Recent labs:   Recent TSH was normal.   CMP was normal.   She had lipid LDL of 114 but otherwise normal cholesterol panel.   A1C on 01/29/13 showed A1C of 5.8%, new glucose intolerance.  Needs repeat of a1c today      Doing well.  Has lost total of 30 lbs.      Start weight=173  Current weight=143  Current BMI:  Body mass index is 26.9 kg/(m^2).      Eating better. Making better choices on food.  Daily moderate intensity exercise that she started 4 weeks after first starting the medication      Denies adverse S/E from medication, no agitation, no mood change, no edema, no CP, SOB, no difficulty sleeping.  No change in urine or bowel habits.   No changes in sexual function.     3.  New problem: She does have some tachycardia today on intake but denies dizziness, SOB, CP or any other symptom; just took medication about 1 hour ago.  She has not been taking daily.  Taking about every other day because it is easier to control appetite now that she has lost weight.    No hx of MI or CAD    No new medications other than what she was previously taking    Denies neurologic symptoms. No numbness or tingling in extremities.    Dry mouth: none  Constipation:  none    12 point ROS reviewed and otherwise negative unless stated above.     Wt Readings from Last 3 Encounters:   09/03/13 143 lb 9.6 oz (65.137 kg)   05/21/13 153 lb (69.4 kg)   04/17/13 161 lb (73.029 kg)     Ht Readings from Last 3 Encounters:   09/03/13 5' 1.25" (1.556 m)   05/21/13 5' 1.25" (1.556 m)   04/17/13 5' 1.25" (1.556 m)         Past Medical History   Diagnosis Date   ??? Antiphospholipid syndrome (HCC)    ??? GERD (gastroesophageal reflux disease)    ??? Acid reflux     ??? Contact dermatitis and other eczema, due to unspecified cause    ??? Psoriasis    ??? Pregnancy      G5P1041; 4 SAB and 1 C/S       Current Outpatient Prescriptions   Medication Sig Dispense Refill   ??? FINACEA 15 % topical gel Apply 15 cm to affected area daily.     ??? ketoconazole (NIZORAL) 2 % shampoo Apply 2 Squirts to affected area daily.     ??? phentermine (ADIPEX-P) 37.5 mg tablet Take 1 Tab by mouth every morning. Max Daily Amount: 37.5 mg. 30 Tab 2   ??? fluocinoNIDE (LIDEX) 0.05 % ointment Apply 0.05 Squirts to affected area two (2) times daily as needed.     ??? Ustekinumab (USTEKINAUMAB) 90 mg/mL injection by SubCUTAneous route once.     ??? levonorgestrel (MIRENA) 20 mcg/24 hr (5 years) IUD 1 each by IntraUTERine route once.     ??? fexofenadine (ALLEGRA) 60 mg tablet Take 60 mg by mouth daily. Takes 1 tab daily  Allergies   Allergen Reactions   ??? Bactrim [Sulfamethoprim Ds] Hives       History     Social History   ??? Marital Status: MARRIED     Spouse Name: N/A     Number of Children: N/A   ??? Years of Education: N/A     Occupational History   ??? Not on file.     Social History Main Topics   ??? Smoking status: Never Smoker    ??? Smokeless tobacco: Never Used   ??? Alcohol Use: No   ??? Drug Use: No   ??? Sexual Activity:     Partners: Male     Pharmacist, hospital Protection: IUD     Other Topics Concern   ??? Not on file     Social History Narrative         OBJECTIVE:  BP 119/77 mmHg   Pulse 115   Temp(Src) 97.6 ??F (36.4 ??C) (Oral)   Resp 18   Ht 5' 1.25" (1.556 m)   Wt 143 lb 9.6 oz (65.137 kg)   BMI 26.90 kg/m2   SpO2 100%    General appearance  alert, cooperative, no distress, appears stated age   Head  Normocephalic, without obvious abnormality, atraumatic   Eyes  conjunctivae/corneas clear. PERRL, EOM's intact.   Heart  Tachycardia without M/R/G ; BP well controlled    Extremities extremities normal, atraumatic, no cyanosis or edema     Pulm: no SOB, non labored, no W/R/R    Labs: A1C performed today      A/P:     ICD-9-CM    1. Glucose intolerance (impaired glucose tolerance) 790.22 A1C performed today    Down today to 5.6%, out of prediabetic range.     Continue weight loss.  Almost to a normal BMI.     2. Abnormal weight gain 783.1 Patient with continued success with weight loss medication.  She is not yet at goal BMI    PMP without aberrant behavior.  Low risk for abuse.     Continue medication until BMI <25.  She understands that using this medication is off label for >3 months but is common practice among [employer] physicians to continue this medication as long as HR, BP well controlled and no adverse S/E or aberrant drug behavior.  She meets qualification to continue this medication.       Reduce phentermine (ADIPEX-P) 37.5 mg tablet to 1/2 tab PO daily     Tachycardia but no symptoms, Monitor HR daily, if >90 after reduction of dose, RTC    Counseled on lifestyle changes and high risk of weight regain if doesn't adopt behavioral changes after stopping medication once at goal BMI and weight           Start Vitamin D supplementation, 1000 international units  Daily upon recommendation from dermatology after reviewing his last consultation note.     Follow-up Disposition:  Return in about 4 weeks (around 10/01/2013) for weight loss and tachycardia.      Dr. Orpah Melter,  D.O.  Physician

## 2013-09-03 NOTE — Patient Instructions (Addendum)
Learning About Dietary Guidelines  What are the Dietary Guidelines for Americans?     Dietary Guidelines for Americans provide tips for eating well and staying healthy. This helps reduce the risk for long-term (chronic) diseases.  These adult guidelines from the [place] government recommend that you:  ?? Eat lots of fruits, vegetables, whole grains, and low-fat or nonfat dairy products.  ?? Try to balance your eating with your activity. This helps you stay at a healthy weight.  ?? Drink alcohol in moderation, if at all.  ?? Limit foods high in salt, saturated fat, trans fat, cholesterol, and added sugar.  What is MyPlate?  MyPlate is the U.S. government's food guide. It can help you make your own well-balanced eating plan. A balanced eating plan means that you eat enough, but not too much, and that your food gives you the nutrients you need to stay healthy.  MyPlate focuses on eating plenty of whole grains, fruits, and vegetables, and on limiting fat and sugar. It is available online at www.https://www.bernard.org/.  How can you get started?  MyPlate suggests that most adults eat certain amounts from the different food groups:  Grains  Eat 5 to 8 ounces of grains each day. Half of those should be whole grains. Choose whole-grain breads, cold and cooked cereals and grains, pasta (without creamy sauces), hard rolls, or low-fat or fat-free crackers.  Vegetables  Eat 2 to 3 cups of vegetables every day. They contain little if any fat. And they have lots of nutrients that help protect against heart disease.  Fruits  Eat 1?? to 2 cups of fruits every day. Fruits contain very little fat but lots of nutrients.  Protein foods  Most adults need 5 to 6?? ounces each day. Choose fish and lean poultry more often. Eat red meat and fried meats less often. Dried beans, tofu, and nuts are also good sources of protein.  Dairy  Most adults need 3 cups of milk and milk products a day. Choose low-fat or  fat-free products from this food group. If you have problems digesting milk, try eating cheese or yogurt instead.  Limit fats and oils, including those used in cooking. When you do use fats, choose oils that are liquid at room temperature (unsaturated fats). These include canola oil and olive oil. Avoid foods with trans fats, such as many fried foods, cookies, and snack foods.   Where can you learn more?   Go to [employer]  Enter [phone] in the search box to learn more about "Learning About Dietary Guidelines."   ?? 2006-2015 Healthwise, Incorporated. Care instructions adapted under license by [employer] (which disclaims liability or warranty for this information). This care instruction is for use with your licensed healthcare professional. If you have questions about a medical condition or this instruction, always ask your healthcare professional. Healthwise, Incorporated disclaims any warranty or liability for your use of this information.  Content Version: 10.5.422740; Current as of: November 15, 2012      How to take heart rate.    Find pulse at wrist (use 2 fingers near thumb side of wrist), count beats for 30 seconds, multiply by 2 then document beats per minute  Goal is <90      Cholecalciferol (By mouth)   1000 mg per day      Cholecalciferol (koe-le-kal-SIF-er-ol)  Treats vitamin D deficiency and maintains bone strength. This is a dietary supplement.   Brand Name(s):Carlson Baby Ddrops, Lisette Grinder Ddrops, [place] Vitamin D3,  Lisette Grinder for Kids Ddrops, Children's Replesta, D-3, D-Vita Drops, D3-5, D3-50, Delta D3, Dialyvite Vitamin D3 Max, Mason Natural Vitamin D, Maximum D3, Nature's Blend Super Strength Vitamin D3   There may be other brand names for this medicine.  When This Medicine Should Not Be Used:   This medicine is generally considered safe for most people. Talk to your doctor if you have concerns.  How to Use This Medicine:    Capsule, Liquid Filled Capsule, Liquid, Tablet, Chewable Tablet, Wafer, Drop  ?? Take your medicine as directed.  ?? Follow the instructions on the medicine label if you are using this medicine without a prescription.  ?? Oral liquid: Use the dropper that comes with the package to measure the dose.  ?? Adults and adolescents: Drop the liquid directly into the mouth or mix it with food or other liquids (water or juice).  ?? Children 75 years of age and older: Drop the liquid directly into the mouth, mix it with food or other liquids (water or juice), or take it from a spoon.  ?? Children younger than 41 years of age: Place one drop of the liquid on the pacifier, mother's nipple, or bottle nipple and allow baby to suck for at least 30 seconds.  ?? Wafer: Chew or crush. Do not swallow whole.  ?? Missed dose: Take a dose as soon as you remember. If it is almost time for your next dose, wait until then and take a regular dose. Do not take extra medicine to make up for a missed dose.  ?? Store the medicine in a closed container at room temperature, away from heat, moisture, and direct light.  Drugs and Foods to Avoid:     Ask your doctor or pharmacist before using any other medicine, including over-the-counter medicines, vitamins, and herbal products.     Warnings While Using This Medicine:   ?? Tell your doctor if you are pregnant or breastfeeding.  ?? Your doctor will check your progress and the effects of this medicine at regular visits. Keep all appointments.  ?? Keep all medicine out of the reach of children. Never share your medicine with anyone.  Possible Side Effects While Using This Medicine:   Call your doctor right away if you notice any of these side effects:  ?? Allergic reaction: Itching or hives, swelling in your face or hands, swelling or tingling in your mouth or throat, chest tightness, trouble breathing  If you notice other side effects that you think are caused by this medicine, tell your doctor.    Call your doctor for medical advice about side effects. You may report side effects to FDA at 1-800-FDA-1088  ?? 2014 [facility]. Information is for End User's use only and may not be sold, redistributed or otherwise used for commercial purposes.  The above information is an educational aid only. It is not intended as medical advice for individual conditions or treatments. Talk to your doctor, nurse or pharmacist before following any medical regimen to see if it is safe and effective for you.

## 2013-09-03 NOTE — Progress Notes (Signed)
"  Reviewed record in preparation for visit and have obtained the necessary documentation"  Chief Complaint   Patient presents with   ??? Weight Management     follow up    ??? Blood sugar problem     follow up        Patient presents in the office today for a weight management follow up since starting Phentermine 37.5 mg   Patient is also following up for impaired glucose tolerance         1. Have you been to the ER, urgent care clinic since your last visit?  Hospitalized since your last visit?No    2. Have you seen or consulted any other health care providers outside of the [facility] since your last visit?  Include any pap smears or colon screening. No

## 2013-10-03 ENCOUNTER — Ambulatory Visit
Admit: 2013-10-03 | Discharge: 2013-10-03 | Payer: PRIVATE HEALTH INSURANCE | Attending: Family Medicine | Primary: Family

## 2013-10-03 DIAGNOSIS — R635 Abnormal weight gain: Secondary | ICD-10-CM

## 2013-10-03 MED ORDER — FLUOCINONIDE 0.05 % OINTMENT
0.05 % | Freq: Two times a day (BID) | CUTANEOUS | Status: DC | PRN
Start: 2013-10-03 — End: 2014-01-30

## 2013-10-03 NOTE — Progress Notes (Signed)
"  Reviewed record in preparation for visit and have obtained the necessary documentation"  Chief Complaint   Patient presents with   ??? Weight Management   ??? Irregular Heart Beat     Patient presents in the office today for a follow up of weight management and tachycardia     Patient given flu vaccine in L deltoid.  Consent has been signed and will be scanned in chart.  Immunization handout given.  Patient will call with any adverse reactions.      1. Have you been to the ER, urgent care clinic since your last visit?  Hospitalized since your last visit?No    2. Have you seen or consulted any other health care providers outside of the [facility] since your last visit?  Include any pap smears or colon screening. No

## 2013-10-03 NOTE — Progress Notes (Signed)
CC: follow up to abnormal weight gain and tachycardia last visit and psoriasis    Problems 1 and 2:    Glucose intolerance with abnormal weight gain and tachycardia last visit    We reduced her phentermine to 1/2 tab PO daily and has not had tachycardia since that time.     Not exercising as much as she use to so only lost 1# in last 1 month, overall has lost 30# and feels good    Started phentermine 37.5 mg PO daily, on 03/21/13 after other etiology for weight gain was ruled out.      Recent labs:   Recent TSH was normal.   CMP was normal.   She had lipid LDL of 114 but otherwise normal cholesterol panel.   A1C on 01/29/13 showed A1C of 5.8%, new glucose intolerance.  09/2013 was 5.6%    Doing well.  Has lost total of 31 lbs.      Start weight=173  Current weight=142  Current BMI:  Body mass index is 26.6 kg/(m^2).      Eating better. Making better choices on food.  She has not been continuing her daily moderate intensity exercise that she started 4 weeks after first starting the medication, in the last month but plans on restarting since she did not lose much weight this past month      Denies adverse S/E from medication, no agitation, no mood change, no edema, no CP, SOB, no difficulty sleeping.  No change in urine or bowel habits.   No changes in sexual function.     3.  Needs refill of lidex for her psoriasis    Chronic issue      Denies neurologic symptoms. No numbness or tingling in extremities.    Dry mouth: none  Constipation:  none    4.  Need flu vaccine today    Never had adverse reaction in the past, is not acutely ill.       12 point ROS reviewed and otherwise negative unless stated above.     Wt Readings from Last 3 Encounters:   10/03/13 142 lb (64.411 kg)   09/03/13 143 lb 9.6 oz (65.137 kg)   05/21/13 153 lb (69.4 kg)     Ht Readings from Last 3 Encounters:   10/03/13 5' 1.25" (1.556 m)   09/03/13 5' 1.25" (1.556 m)   05/21/13 5' 1.25" (1.556 m)         Past Medical History   Diagnosis Date    ??? Antiphospholipid syndrome (HCC)    ??? GERD (gastroesophageal reflux disease)    ??? Acid reflux    ??? Contact dermatitis and other eczema, due to unspecified cause    ??? Psoriasis    ??? Pregnancy      G5P1041; 4 SAB and 1 C/S       Current Outpatient Prescriptions   Medication Sig Dispense Refill   ??? STELARA 45 mg/0.5 mL injection   3   ??? fluocinoNIDE (LIDEX) 0.05 % ointment Apply  to affected area two (2) times daily as needed. 30 g 2   ??? FINACEA 15 % topical gel Apply 15 cm to affected area daily.     ??? ketoconazole (NIZORAL) 2 % shampoo Apply 2 Squirts to affected area daily.     ??? fexofenadine (ALLEGRA) 60 mg tablet Take 60 mg by mouth daily. Takes 1 tab daily     ??? phentermine (ADIPEX-P) 37.5 mg tablet Take 1 Tab by mouth every morning.  Max Daily Amount: 37.5 mg. 30 Tab 2   ??? Ustekinumab (USTEKINAUMAB) 90 mg/mL injection by SubCUTAneous route once.     ??? levonorgestrel (MIRENA) 20 mcg/24 hr (5 years) IUD 1 each by IntraUTERine route once.         Allergies   Allergen Reactions   ??? Bactrim [Sulfamethoprim Ds] Hives       History     Social History   ??? Marital Status: MARRIED     Spouse Name: N/A     Number of Children: N/A   ??? Years of Education: N/A     Occupational History   ??? Not on file.     Social History Main Topics   ??? Smoking status: Never Smoker    ??? Smokeless tobacco: Never Used   ??? Alcohol Use: No   ??? Drug Use: No   ??? Sexual Activity:     Partners: Male     Pharmacist, hospitalBirth Control/ Protection: IUD     Other Topics Concern   ??? Not on file     Social History Narrative         OBJECTIVE:  BP 113/70 mmHg   Pulse 83   Temp(Src) 96.7 ??F (35.9 ??C) (Oral)   Resp 18   Ht 5' 1.25" (1.556 m)   Wt 142 lb (64.411 kg)   BMI 26.60 kg/m2   SpO2 96%    General appearance  alert, cooperative, no distress, appears stated age   Head  Normocephalic, without obvious abnormality, atraumatic   Eyes  conjunctivae/corneas clear. PERRL, EOM's intact.   Heart  Normal rate without M/R/G ; BP well controlled     Extremities extremities normal, atraumatic, no cyanosis or edema     Pulm: no SOB, non labored, no W/R/R    Labs: A1C performed 09/2013 and was 5.6%      A/P:      ICD-10-CM ICD-9-CM    1. Abnormal weight gain R63.5 783.1 Continue phentermine for additional 2 months, if not losing significant weight after that, will need to consider alternate medication for management of abnormal weight gain that is improving with medication intervention.     Glucose intolerance almost fully resolved with A1C of 5.6%  in 09/2013    Patient with continued success with weight loss medication.  She is not yet at goal BMI    PMP without aberrant behavior.  Low risk for abuse.     Continue medication until BMI <25.  She understands that using this medication is off label for >3 months but is common practice among [employer] physicians to continue this medication as long as HR, BP well controlled and no adverse S/E or aberrant drug behavior.  She meets qualification to continue this medication.          2. Encounter for immunization; flu vaccine given today Z23 V03.89 INFLUENZA VIRUS VAC QUAD,SPLIT,PRESV FREE SYRINGE 3/> YRS IM   3. Seborrhea capitis, from psoriasis  L21.0 690.11 fluocinoNIDE (LIDEX) 0.05 % ointment BID to effected area, use with caution for max of 2 weeks      4. Influenza vaccination administered at current visit Z23 V04.81    5. Tachycardia; resolved   R00.0 785.0            Follow-up Disposition:  Return in about 2 months (around 12/03/2013) for Weight management.      Dr. Orpah Melteratherine Rajni Holsworth,  D.O.  Physician

## 2013-10-03 NOTE — Patient Instructions (Signed)
Learning About Dietary Guidelines  What are the Dietary Guidelines for Americans?     Dietary Guidelines for Americans provide tips for eating well and staying healthy. This helps reduce the risk for long-term (chronic) diseases.  These adult guidelines from the [place] government recommend that you:  ?? Eat lots of fruits, vegetables, whole grains, and low-fat or nonfat dairy products.  ?? Try to balance your eating with your activity. This helps you stay at a healthy weight.  ?? Drink alcohol in moderation, if at all.  ?? Limit foods high in salt, saturated fat, trans fat, cholesterol, and added sugar.  What is MyPlate?  MyPlate is the U.S[place] government's food guide. It can help you make your own well-balanced eating plan. A balanced eating plan means that you eat enough, but not too much, and that your food gives you the nutrients you need to stay healthy.  MyPlate focuses on eating plenty of whole grains, fruits, and vegetables, and on limiting fat and sugar. It is available online at www.ChooseMyPlate.gov.  How can you get started?  MyPlate suggests that most adults eat certain amounts from the different food groups:  Grains  Eat 5 to 8 ounces of grains each day. Half of those should be whole grains. Choose whole-grain breads, cold and cooked cereals and grains, pasta (without creamy sauces), hard rolls, or low-fat or fat-free crackers.  Vegetables  Eat 2 to 3 cups of vegetables every day. They contain little if any fat. And they have lots of nutrients that help protect against heart disease.  Fruits  Eat 1?? to 2 cups of fruits every day. Fruits contain very little fat but lots of nutrients.  Protein foods  Most adults need 5 to 6?? ounces each day. Choose fish and lean poultry more often. Eat red meat and fried meats less often. Dried beans, tofu, and nuts are also good sources of protein.  Dairy  Most adults need 3 cups of milk and milk products a day. Choose low-fat or  fat-free products from this food group. If you have problems digesting milk, try eating cheese or yogurt instead.  Limit fats and oils, including those used in cooking. When you do use fats, choose oils that are liquid at room temperature (unsaturated fats). These include canola oil and olive oil. Avoid foods with trans fats, such as many fried foods, cookies, and snack foods.   Where can you learn more?   Go to http://www.healthwise.net/[facility]  Enter D676 in the search box to learn more about "Learning About Dietary Guidelines."   ?? 2006-2015 Healthwise, Incorporated. Care instructions adapted under license by [employer] (which disclaims liability or warranty for this information). This care instruction is for use with your licensed healthcare professional. If you have questions about a medical condition or this instruction, always ask your healthcare professional. Healthwise, Incorporated disclaims any warranty or liability for your use of this information.  Content Version: 10.5.422740; Current as of: November 15, 2012

## 2013-10-06 NOTE — Telephone Encounter (Signed)
Patient called stating that she would need her most recent lab [facility]. The number to fax this information to would be [phone]

## 2013-10-07 NOTE — Addendum Note (Signed)
Addended by: Alphonsus SiasAVADI, BARBARA J on: 10/07/2013 11:47 AM      Modules accepted: Level of Service

## 2013-10-07 NOTE — Telephone Encounter (Signed)
TB test faxed as requested.

## 2013-10-07 NOTE — Addendum Note (Signed)
Addended by: Alphonsus SiasAVADI, BARBARA J on: 10/07/2013 11:39 AM      Modules accepted: Level of Service

## 2013-10-07 NOTE — Progress Notes (Signed)
Per provider. Dx L210 to be dis associated [facility] Replace L210 with L409.

## 2013-10-09 NOTE — Telephone Encounter (Signed)
Beth called from Dr. Lemar LoftyAgarwal's office in regards to the patient. Says she needs the results from the patient's last blood work to be sent over. This information can be faxed over to [phone].

## 2013-10-10 NOTE — Telephone Encounter (Signed)
Labs faxed as requested.

## 2013-10-27 ENCOUNTER — Encounter

## 2013-10-27 NOTE — Telephone Encounter (Signed)
Phone #: [phone]    Patient needs the above medication refilled.

## 2013-10-29 MED ORDER — PHENTERMINE 37.5 MG TAB
37.5 mg | ORAL_TABLET | ORAL | Status: DC
Start: 2013-10-29 — End: 2013-12-05

## 2013-10-29 NOTE — Telephone Encounter (Signed)
Phentermine called in

## 2013-12-05 ENCOUNTER — Ambulatory Visit
Admit: 2013-12-05 | Discharge: 2013-12-05 | Payer: PRIVATE HEALTH INSURANCE | Attending: Family Medicine | Primary: Family

## 2013-12-05 DIAGNOSIS — R635 Abnormal weight gain: Secondary | ICD-10-CM

## 2013-12-05 MED ORDER — NALTREXONE 8 MG-BUPROPION 90 MG TABLET,EXTENDED RELEASE
8-90 mg | ORAL_TABLET | ORAL | Status: DC
Start: 2013-12-05 — End: 2014-03-11

## 2013-12-05 NOTE — Patient Instructions (Addendum)
Back Stretches: Exercises  Your Care Instructions  Here are some examples of exercises for stretching your back. Start each exercise slowly. Ease off the exercise if you start to have pain.  Your doctor or physical therapist will tell you when you can start these exercises and which ones will work best for you.  How to do the exercises  Overhead stretch    ?? Stand comfortably with your feet shoulder-width apart.  ?? Looking straight ahead, raise both arms over your head and reach toward the ceiling. Do not allow your head to tilt back.  ?? Hold for 15 to 30 seconds, then lower your arms to your sides.  ?? Repeat 2 to 4 times.  Side stretch    ?? Stand comfortably with your feet shoulder-width apart.  ?? Raise one arm over your head, and then lean to the other side.  ?? Slide your hand down your leg as you let the weight of your arm gently stretch your side muscles. Hold for 15 to 30 seconds.  ?? Repeat 2 to 4 times on each side.  Press-up    ?? Lie on your stomach, supporting your body with your forearms.  ?? Press your elbows down into the floor to raise your upper back. As you do this, relax your stomach muscles and allow your back to arch without using your back muscles. As your press up, do not let your hips or pelvis come off the floor.  ?? Hold for 15 to 30 seconds, then relax.  ?? Repeat 2 to 4 times.  Relax and rest    ?? Lie on your back with a rolled towel under your neck and a pillow under your knees. Extend your arms comfortably to your sides.  ?? Relax and breathe normally.  ?? Remain in this position for about 10 minutes.  ?? If you can, do this 2 or 3 times each day.  Follow-up care is a key part of your treatment and safety. Be sure to make and go to all appointments, and call your doctor if you are having problems. It's also a good idea to know your test results and keep a list of the medicines you take.   Where can you learn more?   Go to [email or web address]   Enter Y090 in the search box to learn more about "Back Stretches: Exercises."   ?? 2006-2015 Healthwise, Incorporated. Care instructions adapted under license by [employer]  (which disclaims liability or warranty for this information). This care instruction is for use with your licensed healthcare professional. If you have questions about a medical condition or this instruction, always ask your healthcare professional. Healthwise, Incorporated disclaims any warranty or liability for your use of this information.  Content Version: 10.5.422740; Current as of: November 15, 2012            Naltrexone/Bupropion (By mouth)   Bupropion Hydrochloride (bue-PROE-pee-on hye-droe-KLOR-ide), Naltrexone Hydrochloride (nal-TREX-one hye-droe-KLOR-ide)  Used with diet and exercise to help you lose weight.   Brand Name(s):Contrave   There may be other brand names for this medicine.  When This Medicine Should Not Be Used:   This medicine is not right for everyone. Do not use it if you had an allergic reaction to naltrexone or bupropion, you are pregnant, or you have seizures, anorexia, or bulimia.  How to Use This Medicine:   Long Acting Tablet  ?? Take your medicine as directed. Your dose may need to be changed several times to find what works best  for you.  ?? It is best to take this medicine with food or milk. However, do not take this medicine with high-fat meals. This may increase your risk of seizures.  ?? Swallow the extended-release tablet whole. Do not crush, break, or chew it.  ?? This medicine should come with a Medication Guide. Ask your pharmacist for a copy if you do not have one.  ?? Missed dose: Skip the missed dose and go back to your regular dosing schedule. Never take extra medicine to make up for a missed dose.  ?? Store the medicine in a closed container at room temperature, away from heat, moisture, and direct light.  Drugs and Foods to Avoid:   Ask your doctor or pharmacist before using any other medicine, including  over-the-counter medicines, vitamins, and herbal products.  ?? Do not use this medicine and an MAO inhibitor (MAOI) within 14 days of each other. Do not take this medicine if you are using or have used heroin or other narcotic drugs (such as buprenorphine, codeine, methadone, or other habit-forming painkillers) within the past 7 to 10 days. Do not use naltrexone/bupropion if you are also using Zyban?? to quit smoking or Aplenzin?? or Wellbutrin?? for depression, because they also contain bupropion.  ?? Some medicines and foods can affect how naltrexone/bupropion works. Tell your doctor if you are using any of the following:  ?? Amantadine, amiloride, cimetidine, clopidogrel, dopamine, famotidine, levodopa, memantine, metformin, metoprolol, oxaliplatin, pindolol, ranitidine, theophylline, ticlopidine, varenicline  ?? Insulin or diabetes medicine  ?? Medicine to treat depression  ?? Medicine to treat mental illness (haloperidol, risperidone, thioridazine)  ?? Medicine to treat heart rhythm problems (flecainide, procainamide, propafenone)  ?? Medicine to treat HIV or AIDS (efavirenz, lopinavir, ritonavir)  ?? Medicine for pain, diarrhea, cough, or colds  ?? Steroid (such as dexamethasone, hydrocortisone, methylprednisolone, prednisolone, prednisone)  ?? Limit alcohol, or do not drink alcohol at all while you are using this medicine.  Warnings While Using This Medicine:   ?? It is not safe to take this medicine during pregnancy. It could harm an unborn baby. Tell your doctor right away if you become pregnant.  ?? Tell your doctor if you have kidney disease, liver disease, diabetes, glaucoma, heart disease, or high blood pressure.  ?? Tell your doctor if you take barbiturates, benzodiazepines, antiseizure medicine, or sedatives, or if you recently stopped taking them. Tell your doctor if you have a history of drug addiction, or if you drink alcohol.  ?? For some children, teenagers, and young adults, this medicine may  increase mental or emotional problems. This may lead to thoughts of suicide and violence. Talk with your doctor right away if you have any thoughts or behavior changes that concern you. Tell your doctor if you or anyone in your family has a history of bipolar disorder or suicide attempts.  ?? This medicine may cause the following problems:  ?? Increased risk of seizure  ?? High blood pressure or heart rate  ?? Serious allergic and skin reactions  ?? Liver problems  ?? Increased risk of hypoglycemia (low blood sugar) in patients with diabetes  ?? Do not breastfeed while you are using this medicine, unless your doctor says it is okay.  ?? You have a higher risk of accidental overdose, serious injury, or death if you use heroin or any other narcotic medicine while you are being treated with this medicine. Also, naltrexone prevents you from feeling the effects of heroin if you use it.  ??  Do not stop using this medicine suddenly. Your doctor will need to slowly decrease your dose before you stop it completely.  ?? Tell any doctor or dentist who treats you that you are using this medicine. This medicine may affect certain medical test results.  ?? Your doctor will check your progress and the effects of this medicine at regular visits. Keep all appointments.  ?? Keep all medicine out of the reach of children. Never share your medicine with anyone.  Possible Side Effects While Using This Medicine:   Call your doctor right away if you notice any of these side effects:  ?? Allergic reaction: Itching or hives, swelling in your face or hands, swelling or tingling in your mouth or throat, chest tightness, trouble breathing  ?? Blistering, peeling, or red skin rash  ?? Chest pain, trouble breathing, fast, slow, or pounding heartbeat  ?? Dark urine or pale stools, nausea, vomiting, loss of appetite, stomach pain, yellow skin or eyes  ?? Eye pain, vision changes, seeing halos around lights  ?? Muscle or joint pain, fever with rash   ?? Seeing or hearing things that are not there, feeling like people are against you  ?? Seizures  ?? Sudden increase in energy, racing thoughts, trouble sleeping  ?? Thoughts of hurting yourself, worsening depression, severe agitation or confusion  If you notice these less serious side effects, talk with your doctor:   ?? Dry mouth  ?? Headache, dizziness  ?? Nausea, constipation, diarrhea  If you notice other side effects that you think are caused by this medicine, tell your doctor.   Call your doctor for medical advice about side effects. You may report side effects to FDA at 1-800-FDA-1088  ?? 2014 [facility]. Information is for End User's use only and may not be sold, redistributed or otherwise used for commercial purposes.  The above information is an educational aid only. It is not intended as medical advice for individual conditions or treatments. Talk to your doctor, nurse or pharmacist before following any medical regimen to see if it is safe and effective for you.

## 2013-12-05 NOTE — Progress Notes (Signed)
Chief Complaint   Patient presents with   ??? Weight Management     follow up    ??? Flank Pain     right side, intermittent      And new issue    S:  1.  Glucose intolerance with abnormal weight gain     Initially had side effect of tachycardia so I reduced her phentermine to 1/2 tab PO daily in 09/2013 and has not had tachycardia since that time.     Not exercising as much as she use to so only lost 1# in last 1 month, overall has lost 30# and feels good     Started phentermine 37.5 mg PO daily, on 03/21/13 after other etiology for weight gain was ruled out.     In the last 2 months she lost 1 lbs and gained 2 lbs respectively.    Doesn't feel that phentermine is working    Recent labs:   Recent TSH was normal.   CMP was normal.   She had lipid LDL of 114 but otherwise normal cholesterol panel.   A1C on 01/29/13 showed A1C of 5.8%, new glucose intolerance. 09/2013 was 5.6%     Start weight=173   Current weight=144    Current BMI:   Body mass index is 27.05 kg/(m^2).    Eating better. Making better choices on food.     Denies adverse S/E from medication, no agitation, no mood change, no edema, no CP, SOB, no difficulty sleeping. No change in urine or bowel habits.     No changes in sexual function.       2. Patient also has complaints of intermittent right side flank pain x 2 months, pain is 10/10 when it comes.    Frequency: once a week    Last a couple of seconds then abates     Related to: nothing that she can tell    General ROS: negative for - chills, fatigue or fever  Gastrointestinal ROS: no abdominal pain, change in bowel habits, or black or bloody stools    Has a BM daily but does not feel that she goes enough.  BM are hard    Genito-Urinary ROS: no dysuria, trouble voiding, or hematuria    No history of pyleonephritis or kidney stones      Wt Readings from Last 3 Encounters:   12/05/13 144 lb 6.4 oz (65.499 kg)   10/03/13 142 lb (64.411 kg)   09/03/13 143 lb 9.6 oz (65.137 kg)      Ht Readings from Last 3 Encounters:   12/05/13 5' 1.25" (1.556 m)   10/03/13 5' 1.25" (1.556 m)   09/03/13 5' 1.25" (1.556 m)       12 point ROS reviewed and otherwise negative unless stated above.     Past Medical History   Diagnosis Date   ??? Antiphospholipid syndrome (HCC)    ??? GERD (gastroesophageal reflux disease)    ??? Acid reflux    ??? Contact dermatitis and other eczema, due to unspecified cause    ??? Psoriasis    ??? Pregnancy      G5P1041; 4 SAB and 1 C/S       Current Outpatient Prescriptions   Medication Sig Dispense Refill   ??? phentermine (ADIPEX-P) 37.5 mg tablet Take 1 Tab by mouth every morning. Max Daily Amount: 37.5 mg. 30 Tab 2   ??? STELARA 45 mg/0.5 mL injection   3   ??? fluocinoNIDE (LIDEX) 0.05 % ointment Apply  to affected area two (2) times daily as needed. 30 g 2   ??? FINACEA 15 % topical gel Apply 15 cm to affected area daily.     ??? ketoconazole (NIZORAL) 2 % shampoo Apply 2 Squirts to affected area daily.     ??? levonorgestrel (MIRENA) 20 mcg/24 hr (5 years) IUD 1 each by IntraUTERine route once.         Allergies   Allergen Reactions   ??? Bactrim [Sulfamethoprim Ds] Hives       History     Social History   ??? Marital Status: MARRIED     Spouse Name: N/A     Number of Children: N/A   ??? Years of Education: N/A     Occupational History   ??? Not on file.     Social History Main Topics   ??? Smoking status: Never Smoker    ??? Smokeless tobacco: Never Used   ??? Alcohol Use: No   ??? Drug Use: No   ??? Sexual Activity:     Partners: Male     Pharmacist, hospitalBirth Control/ Protection: IUD     Other Topics Concern   ??? Not on file     Social History Narrative         OBJECTIVE:  Filed Vitals:    12/05/13 0804   BP: 122/76   Pulse: 90   Temp: 97.5 ??F (36.4 ??C)   TempSrc: Oral   Resp: 18   Height: 5' 1.25" (1.556 m)   Weight: 144 lb 6.4 oz (65.499 kg)   SpO2: 99%       Gen: NAD  CV: BP well controlled   Pulm: non labored on RA  Back: no CVA tenderness, there is no abnormal muscle tension in thoracic and lumbar muscles   Abd: soft, NT with deep palpation  Extrem: no edema    A/P:    ICD-10-CM ICD-9-CM    1. Abnormal weight gain R63.5 783.1 D/C phentermine, no longer effective in weight loss for the patient    naltrexone-buPROPion (CONTRAVE) 8-90 mg TbER ER tablet; discussed titration of medication verbally and written instructions.     Patient counseled on when to follow up, RTC sooner PRN for any new symptoms, concerns or possible medication S/E.  Discussed with patient indication for return.  Reviewed medications today, possible side effects of medications, reasons for discontinuation and reasons to seek immediate medical care including ER for clinic after hours care and on call physician for our clinic.       2. Right flank pain R10.9 789.09 Unclear etiology but unlikely GU or GI given history.  Possible musculoskeletal, start with HEP stretches, reviewed and give to pt today    F/U if worsening or new symptoms         Counseling:    Patient counseled on when to follow up, RTC sooner PRN for any new symptoms, concerns or possible medication S/E (of contrave).  Discussed with patient indication for return.  Reviewed medications today, possible side effects of medications, reasons for discontinuation and reasons to seek immediate medical care including ER for clinic after hours care and on call physician for our clinic.      Follow-up Disposition:  Return in about 4 weeks (around 01/02/2014) for weight manangement.      Dr. Orpah Melteratherine Paitynn Mikus,  D.O.  Physician

## 2013-12-05 NOTE — Progress Notes (Signed)
"  Reviewed record in preparation for visit and have obtained the necessary documentation"  Chief Complaint   Patient presents with   ??? Weight Management     follow up    ??? Flank Pain     right side, intermittent        Patient presents in the office today for a follow up weight management     Patient also has complaints of intermittent right side flank pain x 2 months, pain is 10/10     PHQ 2 / 9, over the last two weeks 12/05/2013   Little interest or pleasure in doing things Not at all   Feeling down, depressed or hopeless Not at all   Total Score PHQ 2 0           1. Have you been to the ER, urgent care clinic since your last visit?  Hospitalized since your last visit?No    2. Have you seen or consulted any other health care providers outside of the [facility] since your last visit?  Include any pap smears or colon screening. No

## 2014-01-16 ENCOUNTER — Encounter: Attending: Family Medicine | Primary: Family

## 2014-01-29 ENCOUNTER — Encounter: Attending: Family Medicine | Primary: Family

## 2014-01-30 ENCOUNTER — Ambulatory Visit: Admit: 2014-01-30 | Discharge: 2014-01-30 | Payer: PRIVATE HEALTH INSURANCE | Attending: Family | Primary: Family

## 2014-01-30 DIAGNOSIS — J069 Acute upper respiratory infection, unspecified: Secondary | ICD-10-CM

## 2014-01-30 MED ORDER — FLUOCINONIDE 0.05 % OINTMENT
0.05 % | Freq: Two times a day (BID) | CUTANEOUS | Status: DC | PRN
Start: 2014-01-30 — End: 2014-04-21

## 2014-01-30 MED ORDER — AZITHROMYCIN 250 MG TAB
250 mg | ORAL_TABLET | ORAL | Status: AC
Start: 2014-01-30 — End: 2014-02-04

## 2014-01-30 NOTE — Progress Notes (Signed)
1. Have you been to the ER, urgent care clinic since your last visit?  Hospitalized since your last visit?No    2. Have you seen or consulted any other health care providers outside of the [facility] since your last visit?  Include any pap smears or colon screening. No     Chief Complaint   Patient presents with   ??? Cold Symptoms     sinus pressure headaches, runny nose, nasal congestion, started sunday

## 2014-01-30 NOTE — Progress Notes (Signed)
HISTORY OF PRESENT ILLNESS  Christine York is a 39 y.o. female.  HPI: Patient reports nasal congestion, H/A, sinus pressure and yellow nasal discharge for 3 days. Denies cough or chest congestion. Would like refill on he rlidex  Past Medical History   Diagnosis Date   ??? Antiphospholipid syndrome (HCC)    ??? GERD (gastroesophageal reflux disease)    ??? Acid reflux    ??? Contact dermatitis and other eczema, due to unspecified cause    ??? Psoriasis    ??? Pregnancy      G5P1041; 4 SAB and 1 C/S     Past Surgical History   Procedure Laterality Date   ??? Hx wisdom teeth extraction Bilateral    ??? Extraction erupted tooth/exr       Allergies   Allergen Reactions   ??? Bactrim [Sulfamethoprim Ds] Hives     Current outpatient prescriptions:   ???  fluocinoNIDE (LIDEX) 0.05 % ointment, Apply  to affected area two (2) times daily as needed., Disp: 30 g, Rfl: 2  ???  azithromycin (ZITHROMAX) 250 mg tablet, Take 2 tablets today, then take 1 tablet daily, Disp: 6 Tab, Rfl: 0  ???  STELARA 45 mg/0.5 mL injection, , Disp: , Rfl: 3  ???  levonorgestrel (MIRENA) 20 mcg/24 hr (5 years) IUD, 1 each by IntraUTERine route once., Disp: , Rfl:   ???  naltrexone-buPROPion (CONTRAVE) 8-90 mg TbER ER tablet, First Month: Contrave 8mg /90mg  #70  Week 1 : 1 Tab AM Week 2 : 1 Tab AM, 1 Tab PM Week 3 : 2 Tab AM, 1 Tab PM Week 4 : 2 Tab AM, 2 Tab PM, Disp: 70 Tab, Rfl: 0  ???  FINACEA 15 % topical gel, Apply 15 cm to affected area daily., Disp: , Rfl:   ???  ketoconazole (NIZORAL) 2 % shampoo, Apply 2 Squirts to affected area daily., Disp: , Rfl:   Review of Systems   Constitutional: Negative.    HENT: Positive for congestion.    Respiratory: Negative.    Cardiovascular: Negative.    Neurological: Positive for headaches.     Blood pressure 110/60, pulse 95, resp. rate 18, height 5' 1.25" (1.556 m), weight 149 lb (67.586 kg), SpO2 99 %.    Physical Exam   Constitutional:   Over weight female NAD   HENT:   Right Ear: External ear normal.   Left Ear: External ear normal.    Nasopharyngeal erythema, frontal sinus tenderness   Neck: Neck supple.   Cardiovascular: Normal rate, regular rhythm and normal heart sounds.    No murmur heard.  Pulmonary/Chest: Effort normal and breath sounds normal.   Nursing note and vitals reviewed.      ASSESSMENT and PLAN    ICD-10-CM ICD-9-CM    1. URI (upper respiratory infection) J06.9 465.9 azithromycin (ZITHROMAX) 250 mg tablet   2. Seborrhea capitis L21.0 690.11 fluocinoNIDE (LIDEX) 0.05 % ointment   Pt was given an after visit summary which includes diagnosis, current medicines and vital and voiced understanding of treatment plan

## 2014-01-30 NOTE — Patient Instructions (Signed)
Upper Respiratory Infection (Cold): Care Instructions  Your Care Instructions     An upper respiratory infection, or URI, is an infection of the nose, sinuses, or throat. URIs are spread by coughs, sneezes, and direct contact. The common cold is the most frequent kind of URI. The flu and sinus infections are other kinds of URIs.  Almost all URIs are caused by viruses. Antibiotics won't cure them. But you can treat most infections with home care. This may include drinking lots of fluids and taking over-the-counter pain medicine. You will probably feel better in 4 to 10 days.  The doctor has checked you carefully, but problems can develop later. If you notice any problems or new symptoms, get medical treatment right away.  Follow-up care is a key part of your treatment and safety. Be sure to make and go to all appointments, and call your doctor if you are having problems. It's also a good idea to know your test results and keep a list of the medicines you take.  How can you care for yourself at home?  ?? To prevent dehydration, drink plenty of fluids, enough so that your urine is light yellow or clear like water. Choose water and other caffeine-free clear liquids until you feel better. If you have kidney, heart, or liver disease and have to limit fluids, talk with your doctor before you increase the amount of fluids you drink.  ?? Take an over-the-counter pain medicine, such as acetaminophen (Tylenol), ibuprofen (Advil, Motrin), or naproxen (Aleve). Read and follow all instructions on the label.  ?? Before you use cough and cold medicines, check the label. These medicines may not be safe for young children or for people with certain health problems.  ?? Be careful when taking over-the-counter cold or flu medicines and Tylenol at the same time. Many of these medicines have acetaminophen, which is Tylenol. Read the labels to make sure that you are not taking  more than the recommended dose. Too much acetaminophen (Tylenol) can be harmful.  ?? Get plenty of rest.  ?? Do not smoke or allow others to smoke around you. If you need help quitting, talk to your doctor about stop-smoking programs and medicines. These can increase your chances of quitting for good.  When should you call for help?  Call 911 anytime you think you may need emergency care. For example, call if:  ?? You have severe trouble breathing.  Call your doctor now or seek immediate medical care if:  ?? You seem to be getting much sicker.  ?? You have new or worse trouble breathing.  ?? You have a new or higher fever.  ?? You have a new rash.  Watch closely for changes in your health, and be sure to contact your doctor if:  ?? You have a new symptom, such as a sore throat, an earache, or sinus pain.  ?? You cough more deeply or more often, especially if you notice more mucus or a change in the color of your mucus.  ?? You do not get better as expected.   Where can you learn more?   Go to http://www.healthwise.net/[facility]  Enter K520 in the search box to learn more about "Upper Respiratory Infection (Cold): Care Instructions."   ?? 2006-2015 Healthwise, Incorporated. Care instructions adapted under license by [place] (which disclaims liability or warranty for this information). This care instruction is for use with your licensed healthcare professional. If you have questions about a medical condition or this instruction, always   ask your healthcare professional. Healthwise, Incorporated disclaims any warranty or liability for your use of this information.  Content Version: 10.7.482551; Current as of: August 22, 2013

## 2014-02-25 NOTE — Telephone Encounter (Signed)
Patient's #: [phone]    Patient called stating that she needs to have a refill on her Phentermine prescription. Pharmacy on file verified but medication was not in chart.

## 2014-02-26 NOTE — Telephone Encounter (Addendum)
Christine Pileatherine W Varney, DO   [name], LPN (You)   13 hours ago   (8:57 PM)    Verify with patient. I stopped phentermine in 12/2013 because not working anymore, switched her to [employer]rave. Is this what she requesting? Contrave?    Also needs follow up in 03/2014 to make sure she has lost at least 5% body weight since being on contrave to know if we can continue this medication and to monitor her vitals.     CV (Routing comment)    Called pt to let her know of above and pt states that she never started the contrave.  She was asking for the phentermine b/c she thinks that the phentermine was working b/c she had gotten use to it.  She will give the contrave a try if Dr Candis ShineVarney thinks that is best.  Advised that I will get back with her.

## 2014-03-04 NOTE — Telephone Encounter (Signed)
Reggie Pileatherine W Varney, DO   Elon SpannerMary J Dyami Umbach, LPN (You)   17 hours ago   (4:00 PM)    If going to start phentermine again, requires office visit for restarting a controlled substance and also weight must be documented so we can make sure she is losing weight on the medication.     CV (Routing comment)    PI of above recommendations and she was transferred to schedule appt.

## 2014-03-11 ENCOUNTER — Ambulatory Visit
Admit: 2014-03-11 | Discharge: 2014-03-11 | Payer: PRIVATE HEALTH INSURANCE | Attending: Family Medicine | Primary: Family

## 2014-03-11 DIAGNOSIS — R7302 Impaired glucose tolerance (oral): Secondary | ICD-10-CM

## 2014-03-11 MED ORDER — PHENTERMINE 37.5 MG TAB
37.5 mg | ORAL_TABLET | ORAL | Status: DC
Start: 2014-03-11 — End: 2014-04-30

## 2014-03-11 MED ORDER — TRETINOIN 0.05 % TOPICAL CREAM
0.05 % | Freq: Every evening | CUTANEOUS | Status: DC
Start: 2014-03-11 — End: 2015-01-14

## 2014-03-11 NOTE — Patient Instructions (Signed)
Learning About Dietary Guidelines  What are the Dietary Guidelines for Americans?     Dietary Guidelines for Americans provide tips for eating well and staying healthy. This helps reduce the risk for long-term (chronic) diseases.  These adult guidelines from the [place] recommend that you:  ?? Eat lots of fruits, vegetables, whole grains, and low-fat or nonfat dairy products.  ?? Try to balance your eating with your activity. This helps you stay at a healthy weight.  ?? Drink alcohol in moderation, if at all.  ?? Limit foods high in salt, saturated fat, trans fat, cholesterol, and added sugar.  What is MyPlate?  MyPlate is the U.S[place] government's food guide. It can help you make your own well-balanced eating plan. A balanced eating plan means that you eat enough, but not too much, and that your food gives you the nutrients you need to stay healthy.  MyPlate focuses on eating plenty of whole grains, fruits, and vegetables, and on limiting fat and sugar. It is available online at www.CashmereCloseouts.hu.  How can you get started?  MyPlate suggests that most adults eat certain amounts from the different food groups:  Grains  Eat 5 to 8 ounces of grains each day. Half of those should be whole grains. Choose whole-grain breads, cold and cooked cereals and grains, pasta (without creamy sauces), hard rolls, or low-fat or fat-free crackers.  Vegetables  Eat 2 to 3 cups of vegetables every day. They contain little if any fat. And they have lots of nutrients that help protect against heart disease.  Fruits  Eat 1?? to 2 cups of fruits every day. Fruits contain very little fat but lots of nutrients.  Protein foods  Most adults need 5 to 6?? ounces each day. Choose fish and lean poultry more often. Eat red meat and fried meats less often. Dried beans, tofu, and nuts are also good sources of protein.  Dairy  Most adults need 3 cups of milk and milk products a day. Choose low-fat or  fat-free products from this food group. If you have problems digesting milk, try eating cheese or yogurt instead.  Limit fats and oils, including those used in cooking. When you do use fats, choose oils that are liquid at room temperature (unsaturated fats). These include canola oil and olive oil. Avoid foods with trans fats, such as many fried foods, cookies, and snack foods.   Where can you learn more?   Go to [employer]Nylon.com.cy  Enter [phone] in the search box to learn more about "Learning About Dietary Guidelines."   ?? 2006-2015 Healthwise[employer]. Care instructions adapted under license by [employer] (which disclaims liability or warranty for this information). This care instruction is for use with your licensed healthcare professional. If you have questions about a medical condition or this instruction, always ask your healthcare professional. Leonard any warranty or liability for your use of this information.  Content Version: 10.7.482551; Current as of: November 15, 2012            Phentermine (By mouth)   Phentermine (FEN-ter-meen)  Helps you lose weight when used for a short time.   Brand Name(s):Adipex-P, Phentercot   There may be other brand names for this medicine.  When This Medicine Should Not Be Used:   Do not use this medicine if you have had an allergic reaction to phentermine or similar medicines (such as adrenaline, amphetamine, dopamine, dobutamine, ephedrine, or lisdexamfetamine). Do not use this medicine if you are pregnant or  breastfeeding. Do not use this medicine if you are normally nervous and tense or if you have glaucoma, an overactive thyroid, heart or blood vessel disease, heart rhythm problems, uncontrolled high blood pressure, or a history of stroke or drug abuse. Do not use this medicine if you also take an MAO inhibitor (MAOI), such as isocarboxazid, phenelzine, selegiline, tranylcypromine, Eldepryl??,  Marplan??, Nardil??, or Parnate??, or if you have taken an MAOI within the past 14 days.  How to Use This Medicine:   Dissolving Tablet, Capsule, Long Acting Capsule, Tablet, Dissolving Tablet  ?? Take your medicine as directed.  ?? This medicine is not for long-term use.  ?? To avoid trouble sleeping, always take this medicine in the morning and never at bedtime or late in the evening.   ?? Take the tablet before breakfast or 1 to 2 hours after breakfast.  ?? Take the disintegrating tablet in the morning, with or without food.  ?? Take the capsule 2 hours after breakfast.  ?? Take the extended-release capsule before breakfast.  ?? Swallow the extended-release capsule whole. Do not crush, break, or chew it.  ?? If you are using the disintegrating tablet, make sure your hands are dry before you handle the tablet. Place the tablet on your tongue. It should melt quickly. After the tablet has melted, swallow or take a drink of water.  ?? Carefully follow your doctor's instructions about any special diet.  If a dose is missed:   ?? Take a dose as soon as you remember. If it is almost time for your next dose, wait until then and take a regular dose. Do not take extra medicine to make up for a missed dose.  How to Store and Dispose of This Medicine:   ?? Store the medicine in a closed container at room temperature, away from heat, moisture, and direct light.  ?? Ask your pharmacist, doctor, or health caregiver about the best way to dispose of any outdated medicine or medicine no longer needed.  ?? Keep all medicine out of the reach of children. Never share your medicine with anyone.  Drugs and Foods to Avoid:   Ask your doctor or pharmacist before using any other medicine, including over-the-counter medicines, vitamins, and herbal products.  ?? Make sure your doctor knows if you also use amphetamines (such as dextroamphetamine, methamphetamine, Dexedrine??, Desoxyn??) or other diet  pills. Tell your doctor if you use insulin or diabetes medicine that you take by mouth (such as glimepiride, glipizide, glyburide, metformin, Actos??, Actoplus Met??, Janumet??, Januvia??, Prandin??) or medicine for depression (such as fluoxetine, fluvoxamine, paroxetine, sertraline, Luvox??, Paxil??, Prozac??, Zoloft??).  ?? Do not drink alcohol while you are using this medicine.  Warnings While Using This Medicine:   ?? It is not safe to take this medicine during pregnancy. It could harm an unborn baby. Tell your doctor right away if you become pregnant.  ?? Make sure your doctor knows if you have kidney disease, diabetes, heart disease, or high blood pressure. Tell your doctor if you are allergic to tartrazine or aspirin.  ?? Check with your doctor right away if you develop shortness of breath, chest pain, fainting, or swelling of the lower legs, ankles, or feet. These may be symptoms of a serious lung disease.  ?? This medicine can be habit-forming. Do not use more than your prescribed dose. Call your doctor if you think your medicine is not working.  ?? Do not stop using this medicine suddenly. Your doctor will need  to slowly decrease your dose before you stop it completely.  ?? This medicine may make you dizzy or drowsy. Do not drive, use machines, or do anything else that could be dangerous until you know how this medicine affects you.  ?? This medicine may affect blood sugar levels in patients with diabetes. Check with your doctor if you have any questions.  Possible Side Effects While Using This Medicine:   Call your doctor right away if you notice any of these side effects:  ?? Allergic reaction: Itching or hives, swelling in your face or hands, swelling or tingling in your mouth or throat, chest tightness, trouble breathing  ?? Fast, slow, pounding, or uneven heartbeat  ?? Seizures or tremors  ?? Severe headache  ?? Shortness of breath  ?? Swelling of your feet or lower legs   If you notice these less serious side effects, talk with your doctor:   ?? Changes in sex drive  ?? Dizziness, drowsiness, mild headache  ?? Dry mouth or a bad taste in your mouth  ?? Nausea, vomiting, diarrhea, constipation, stomach cramps  ?? Restlessness or nervousness  ?? Trouble sleeping  If you notice other side effects that you think are caused by this medicine, tell your doctor.   Call your doctor for medical advice about side effects. You may report side effects to FDA at 1-800-FDA-1088  ?? 2014 [place] is for End User's use only and may not be sold, redistributed or otherwise used for commercial purposes.  The above information is an educational aid only. It is not intended as medical advice for individual conditions or treatments. Talk to your doctor, nurse or pharmacist before following any medical regimen to see if it is safe and effective for you.

## 2014-03-11 NOTE — Progress Notes (Signed)
Chief Complaint   Patient presents with   ??? Weight Management     follow up    ??? Lesion     nose area x several months      And new issue    S:  1.  Glucose intolerance with abnormal weight gain; last seen on 12/05/13 for this issue    Initially had side effect of tachycardia so I reduced her phentermine to 1/2 tab PO daily in 09/2013 and has not had tachycardia since that time.     Was on phentermine from 03/21/13 to 12/2013 and overall lost 30 lbs    Wt Readings from Last 3 Encounters:   03/11/14 152 lb 9.6 oz (69.219 kg)   01/30/14 149 lb (67.586 kg)   12/05/13 144 lb 6.4 oz (65.499 kg)     Since last visit with me in 12/2013, stopped phentermine since was no longer losing weight and was gaining weight on this medication and she has regained 6 lbs. She felt that the phentermine stopped working.      Given that phentermine was no longer working, I started [employer] on 12/2013 but she never filled it    She would like to go back on phentermine, now that has been off for a bit, hoping that she will benefit again, she is highly motivated, ready to start exercising now that good weather      Recent labs (from 2015) reviewed today:   Recent TSH was normal.   CMP was normal.   She had lipid LDL of 114 but otherwise normal cholesterol panel.   A1C on 01/29/13 showed A1C of 5.8%, new glucose intolerance. 09/2013 was 5.6%     Start weight=173   Current weight=144    Current BMI:   Body mass index is 28.59 kg/(m^2).    Denies adverse S/E from medication in the past including, no agitation, no mood change, no edema, no CP, SOB, no difficulty sleeping. No change in urine or bowel habits.     No changes in sexual function.       2. Lesion     Location: on nose    Duration: several months    Associated symptoms: no fevers, chills, weight loss, night sweats    Bothersome to the patient    Tried steroid cream in the past and was not effective    Tried to remove self and not effective    History of this prior: none     Family history of skin cancer: none      12 point ROS reviewed and otherwise negative unless stated above.     Past Medical History   Diagnosis Date   ??? Antiphospholipid syndrome (HCC)    ??? GERD (gastroesophageal reflux disease)    ??? Acid reflux    ??? Contact dermatitis and other eczema, due to unspecified cause    ??? Psoriasis    ??? Pregnancy      [id number]; 4 SAB and 1 C/S       Current Outpatient Prescriptions   Medication Sig Dispense Refill   ??? cetirizine (ZYRTEC) 10 mg tablet Take  by mouth.     ??? fluocinoNIDE (LIDEX) 0.05 % ointment Apply  to affected area two (2) times daily as needed. 30 g 2   ??? STELARA 45 mg/0.5 mL injection   3   ??? levonorgestrel (MIRENA) 20 mcg/24 hr (5 years) IUD 1 each by IntraUTERine route once.         Allergies  Allergen Reactions   ??? Bactrim [Sulfamethoprim Ds] Hives       History     Social History   ??? Marital Status: MARRIED     Spouse Name: N/A   ??? Number of Children: N/A   ??? Years of Education: N/A     Occupational History   ??? Not on file.     Social History Main Topics   ??? Smoking status: Never Smoker    ??? Smokeless tobacco: Never Used   ??? Alcohol Use: No   ??? Drug Use: No   ??? Sexual Activity:     Partners: Male     Pharmacist, hospitalBirth Control/ Protection: IUD     Other Topics Concern   ??? Not on file     Social History Narrative         OBJECTIVE:  Filed Vitals:    03/11/14 0820   BP: 114/76   Pulse: 81   Temp: 97.3 ??F (36.3 ??C)   TempSrc: Oral   Resp: 18   Height: 5' 1.25" (1.556 m)   Weight: 152 lb 9.6 oz (69.219 kg)   SpO2: 99%       Gen: NAD  CV: BP well controlled   Pulm: non labored on RA  Nose: there is white papule on nose without erythema  Extrem: no edema    A/P:    ICD-10-CM ICD-9-CM    1. Glucose intolerance (impaired glucose tolerance) R73.02 790.22 HEMOGLOBIN A1C      phentermine (ADIPEX-P) 37.5 mg tablet; see below     2. 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 Abnormal weight gain R63.5 783.1 phentermine (ADIPEX-P) 37.5 mg tablet PO daily, start with 1/2 tab PO daily for next 2-4 weeks will titrate up as tolerates           Counseling:    Patient counseled on when to follow up, RTC sooner PRN for any new symptoms, concerns or possible medication S/E (of phentermine).  Discussed with patient indication for return.  Reviewed medications today, possible side effects of medications, reasons for discontinuation and reasons to seek immediate medical care including ER for clinic after hours care and on call physician for our clinic.      Time: 30 minutes was spent with this patient face to face discussing labs, follow up visits, and when repeat labs.  I discussed diagnoses, risk factors and treatment for each based on current recommendations and literature.  Greater than 50% of this time was spent in counseling and coordination of care. Counseling specifics include: as above with diagnosis, treatment and other options for care and below education.     Discussed following with patient, that below can be used for weight loss to occur:    -Exercise, discussed FIT principal for training.  Discussed that on average only 2% of weight loss is contributed to exercise but still must be done given that those who kept weight off after losing, after 2 years, exercised the most.  Goal exercise is 60 minutes MOST days of week (5-7 days per week) moderate intensity.     -Behavioral changes  -Diet modification    45 g of carbs per meal or less  25 g of protein per meal or more  -Medications; discussed action, most common side effects of the following FDA approved medications for weight  loss.  Contrave, Orlistat, Phentermine-Topirimate ER, Locaserin, phentermine and the non FDA approved for weight loss, metformin.  Discussed average weight loss with each. Discussed seratonin syndrome and how NO documented cases with phentermine  and SSRI use and that this is a theoretical risk.  Reviewed signs of seratonin syndrome and gave hand out on these side effects to watch for.      Based on above, patient would like to trial: phentermine again    Goal weight loss for next 3 months; 5% of current weight     Discussed risk of obesity and BMI >30 and risk factors for other diseases if weight loss not achieved.   Discussed cost of medication with patient  Labs: urine drug screen not indicated but may consider if any aberrant behavior in future to monitor use of this medication.    Reviewed patient's previous A1C, Lipid, TSH, CMP and CBC   Patient stated that she WILL NOT share this medication with anyone.       Follow-up Disposition:  Return in about 4 weeks (around 04/08/2014) for weight management.      Dr. Orpah Melter,  D.O.  Physician

## 2014-03-11 NOTE — Progress Notes (Signed)
"  Reviewed record in preparation for visit and have obtained the necessary documentation"  Chief Complaint   Patient presents with   ??? Weight Management     follow up    ??? Lesion     nose area x several months        Patient presents in the office today for a follow up of weight management and possibly     Patient also would like to be evaluated for lesion on nose area for     1. Have you been to the ER, urgent care clinic since your last visit?  Hospitalized since your last visit?No    2. Have you seen or consulted any other health care providers outside of the [facility] since your last visit?  Include any pap smears or colon screening. No

## 2014-03-12 LAB — LIPID PANEL
Cholesterol, total: 206 mg/dL — ABNORMAL HIGH (ref 100–199)
HDL Cholesterol: 80 mg/dL (ref >39)
LDL, calculated: 118 mg/dL — ABNORMAL HIGH (ref 0–99)
Triglyceride: 41 mg/dL (ref 0–149)
VLDL, calculated: 8 mg/dL (ref 5–40)

## 2014-03-12 LAB — CVD REPORT

## 2014-03-12 LAB — HEMOGLOBIN A1C WITH EAG: Hemoglobin A1c: 5.4 % (ref 4.8–5.6)

## 2014-04-15 ENCOUNTER — Encounter: Attending: Family Medicine | Primary: Family

## 2014-04-21 MED ORDER — FLUOCINONIDE 0.05 % OINTMENT
0.05 % | CUTANEOUS | Status: DC
Start: 2014-04-21 — End: 2014-05-28

## 2014-04-23 ENCOUNTER — Encounter: Attending: Family Medicine | Primary: Family

## 2014-04-30 ENCOUNTER — Ambulatory Visit
Admit: 2014-04-30 | Discharge: 2014-04-30 | Payer: PRIVATE HEALTH INSURANCE | Attending: Family Medicine | Primary: Family

## 2014-04-30 DIAGNOSIS — R635 Abnormal weight gain: Secondary | ICD-10-CM

## 2014-04-30 MED ORDER — PHENTERMINE 37.5 MG TAB
37.5 mg | ORAL_TABLET | ORAL | Status: DC
Start: 2014-04-30 — End: 2014-09-01

## 2014-04-30 NOTE — Progress Notes (Signed)
"  Reviewed record in preparation for visit and have obtained the necessary documentation"\\  Chief Complaint   Patient presents with   ??? Psoriasis     discuss    ??? Weight Management     follow up       Patient presents in the office today for a follow up of weight management since starting Phentermine 37.5 mg     Patient also would like to discuss transitioning care of psoriasis treatment to PCP Dr.Varney instead of dermatologist     1. Have you been to the ER, urgent care clinic since your last visit?  Hospitalized since your last visit?No    2. Have you seen or consulted any other health care providers outside of [facility] since your last visit?  Include any pap smears or colon screening. No

## 2014-04-30 NOTE — Progress Notes (Signed)
Chief Complaint   Patient presents with   ??? Psoriasis     discuss    ??? Weight Management     follow up     And new issue    S:  1.  Glucose intolerance with abnormal weight gain; last seen on 03/11/14 for this issue    Initially had side effect of tachycardia so I reduced her phentermine to 1/2 tab PO daily in 09/2013 and has not had tachycardia since that time.     Was on phentermine from 03/21/13 to 12/2013 and overall lost 30 lbs    Wt Readings from Last 3 Encounters:   04/30/14 154 lb (69.854 kg)   03/11/14 152 lb 9.6 oz (69.219 kg)   01/30/14 149 lb (67.586 kg)     Phentermine stopped from 12/2013 to 03/11/14, stopped phentermine since was no longer losing weight and was gaining weight on this medication and she has regained 6 lbs. She felt that the phentermine stopped working.      Given that phentermine was no longer working, I started [employer] on 12/2013 but she never filled it. She wanted to go back on phentermine, now that has been off for a bit, hoping that she will benefit again, she is highly motivated, ready to start exercising now that good weather so it was restarted 03/11/14    Since last visit, she has gained weight  Start weight=173     Recent labs (from 2015) reviewed today:   Recent TSH was normal.   CMP was normal.   She had lipid LDL of 114 but otherwise normal cholesterol panel.   A1C on 01/29/13 showed A1C of 5.8%, new glucose intolerance. 09/2013 was 5.6%     Current BMI:   Body mass index is 28.85 kg/(m^2).    Denies adverse S/E from medication in the past including, no agitation, no mood change, no edema, no CP, SOB, no difficulty sleeping. No change in urine or bowel habits.     No changes in sexual function.       2. Psoriasis    Want me to take over care of this    Duration:   Diagnosed about 2 years ago, was seeing Dr. Areatha KeasPhiffer and wants to transition care; saw [facility] and he told her that she should have [facility] something [facility] but he kept her on it.     She tried multiple topical and PO medication before injection that were not effective    Location: back, buttocks, back of legs      Severity: some mild flare up currently     Timing:   Worse at start of spring    Associated signs and symptoms:   Itching but not painful    Modifying factors:     Current medications for this condition:  Medication Sig   ??? fluocinoNIDE (LIDEX) 0.05 % ointment APPLY TO THE AFFECTED AREA TWICE DAILY AS NEEDED   ??? STELARA 45 mg/0.5 mL injection      Medications tried in past and were they helpful: bactrim, topicals    Family history of skin cancer: none      12 point ROS reviewed and otherwise negative unless stated above.     Past Medical History   Diagnosis Date   ??? Antiphospholipid syndrome (HCC)    ??? GERD (gastroesophageal reflux disease)    ??? Acid reflux    ??? Contact dermatitis and other eczema, due to unspecified cause    ??? Psoriasis    ???  Pregnancy      [id number]; 4 SAB and 1 C/S       Current Outpatient Prescriptions   Medication Sig Dispense Refill   ??? fluocinoNIDE (LIDEX) 0.05 % ointment APPLY TO THE AFFECTED AREA TWICE DAILY AS NEEDED 30 g 0   ??? cetirizine (ZYRTEC) 10 mg tablet Take  by mouth.     ??? tretinoin (RETIN-A) 0.05 % topical cream Apply  to affected area nightly. 20 g 0   ??? phentermine (ADIPEX-P) 37.5 mg tablet Take 1 Tab by mouth every morning. Max Daily Amount: 37.5 mg. 30 Tab 0   ??? STELARA 45 mg/0.5 mL injection   3   ??? levonorgestrel (MIRENA) 20 mcg/24 hr (5 years) IUD 1 each by IntraUTERine route once.         Allergies   Allergen Reactions   ??? Bactrim [Sulfamethoprim Ds] Hives       History     Social History   ??? Marital Status: MARRIED     Spouse Name: N/A   ??? Number of Children: N/A   ??? Years of Education: N/A     Occupational History   ??? Not on file.     Social History Main Topics   ??? Smoking status: Never Smoker    ??? Smokeless tobacco: Never Used   ??? Alcohol Use: No   ??? Drug Use: No   ??? Sexual Activity:     Partners: Male     Pharmacist, hospital Protection: IUD      Other Topics Concern   ??? 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 Abnormal weight gain R63.5 783.1 phentermine (ADIPEX-P) 37.5 mg tablet    See below    Discussed if she does not achieve 7 lbs weight loss, this medication will be discontinued     2. Psoriasis L40.9 696.1 CBC W/O DIFF   3. Medication monitoring encounter Z51.81 V58.83 CBC W/O DIFF   4. Glucose intolerance (impaired glucose tolerance) R73.02 790.22 phentermine (ADIPEX-P) 37.5 mg tablet       Counseling:    Patient counseled on when to follow up, RTC sooner PRN for any new symptoms, concerns or possible medication S/E (of phentermine).  Discussed with patient indication for return.  Reviewed medications today, possible side effects of medications, reasons for discontinuation and reasons to seek immediate medical care including ER for clinic after hours care and on call physician for our clinic.      Time: 30 minutes was spent with this patient face to face discussing labs for monitoring for Stelera use, follow up visits, and when repeat labs.  I discussed diagnoses, risk factors and treatment for each based on current recommendations and literature.  Greater than 50% of this time was spent in counseling and coordination  of care. Counseling specifics include: as above with diagnosis, treatment and other options for care and below education.     Discussed following with patient, that below can be used for weight loss to occur:     -Exercise, discussed FIT principal for training.  Discussed that on average only 2% of weight loss is contributed to exercise but still must be done given that those who kept weight off after losing, after 2 years, exercised the most.  Goal exercise is 60 minutes MOST days of week (5-7 days per week) moderate intensity.     -Behavioral changes  -Diet modification    45 g of carbs per meal or less  25 g of protein per meal or more    Goal weight loss for next 3 months; 5% of current weight; 7 lbs    Follow-up Disposition:  Return in about 4 weeks (around 05/28/2014) for abnormal weight gain.      Dr. Orpah Melter,  D.O.  Physician

## 2014-04-30 NOTE — Patient Instructions (Signed)
Psoriasis: Care Instructions  Your Care Instructions  Psoriasis (say "suh-RY-uh-sus") is a long-term skin problem that causes thick, white, silvery, or red patches on the skin. The patches may be small or large, and they occur most often on the knees, elbows, scalp, hands, feet, or lower back.  The skin may be scaly. If the condition is severe, your skin can become itchy and tender. Psoriasis also can be embarrassing if the patches are on visible areas.  You can treat psoriasis with good care at home and with medicine from your doctor. You may put medicine on your skin and take pills or have shots to stop the redness and swelling. Your doctor also may suggest ultraviolet light treatments.  Follow-up care is a key part of your treatment and safety. Be sure to make and go to all appointments, and call your doctor if you are having problems. It's also a good idea to know your test results and keep a list of the medicines you take.  How can you care for yourself at home?  ?? If your doctor prescribes medicine, use it exactly as prescribed. Follow your doctor's advice for sunlight or ultraviolet light treatment. Call your doctor if you think you are having a problem with your medicine.  ?? Protect your skin:  ?? Keep your skin moist. After bathing, put an ointment, cream, or lotion on your skin while it is still damp. This seals in moisture. Use over-the-counter products that your doctor suggests. These may include Cetaphil, Lubriderm, or Eucerin. Petroleum jelly (such as Vaseline) and vegetable shortening (such as Crisco) also work.  ?? If you have psoriasis on your scalp, use a shampoo with salicylic acid, such as Neutrogena T/Sal.  ?? Avoid harsh skin products, such as those that contain alcohol.  ?? Cover your skin in cold weather.  ?? Try to prevent sunburn. Although short periods of sun exposure reduce psoriasis in most people, too much sun can damage the skin and cause skin  cancer. In addition, sunburns can trigger psoriasis. Use sunscreen on areas of your skin that do not have psoriasis. Make sure to use a broad-spectrum sunscreen that has a sun protection factor (SPF) of 30 or higher. Use it every day, even when it is cloudy.  ?? Take care to avoid accidents such as cutting or scraping your skin. An injury to the skin can cause psoriasis patches to form anywhere on the body, including the area of the injury.  ?? Avoid tight shoes, clothing, watchbands, and hats. These may irritate your skin.  ?? Use a vaporizer or humidifier to add moisture to your bedroom. Follow the directions for cleaning the machine.  ?? Try making one or more changes to your daily habits to help with managing your psoriasis. For example:  ?? Try to control stress and anxiety. They may cause psoriasis to appear suddenly or can make symptoms worse.  ?? If you smoke, think about quitting. If you need help quitting, talk to your doctor about stop-smoking programs and medicines.  ?? If you drink, limit or reduce the amount of alcohol you drink.  ?? If you are overweight, see if you can lose some weight.  ?? Seek support from family and friends. Talk to a counselor or other professional if you feel sad about your condition and need more help.  When should you call for help?  Call your doctor now or seek immediate medical care if:  ?? You have signs of infection, such as:  ??   Increased pain, swelling, warmth, or redness.  ?? Red streaks leading from the area.  ?? Pus draining from the area.  ?? A fever.  Watch closely for changes in your health, and be sure to contact your doctor if:  ?? You have swelling, stiffness, or pain in your joints.  ?? Your skin is more red and irritated than usual, especially if you also have another illness.  ?? You need to talk to someone about how you are coping with the illness.   Where can you learn more?   Go to http://www.healthwise.net/[facility]   Enter U759 in the search box to learn more about "Psoriasis: Care Instructions."   ?? 2006-2016 Healthwise, Incorporated. Care instructions adapted under license by [employer] (which disclaims liability or warranty for this information). This care instruction is for use with your licensed healthcare professional. If you have questions about a medical condition or this instruction, always ask your healthcare professional. Healthwise, Incorporated disclaims any warranty or liability for your use of this information.  Content Version: 10.8.513193; Current as of: July 03, 2013

## 2014-05-01 LAB — CBC W/O DIFF
HCT: 43.9 % (ref 34.0–46.6)
HGB: 14.3 g/dL (ref 11.1–15.9)
MCH: 29.6 pg (ref 26.6–33.0)
MCHC: 32.6 g/dL (ref 31.5–35.7)
MCV: 91 fL (ref 79–97)
PLATELET: 336 10*3/uL (ref 150–379)
RBC: 4.83 x10E6/uL (ref 3.77–5.28)
RDW: 13.7 % (ref 12.3–15.4)
WBC: 7.3 10*3/uL (ref 3.4–10.8)

## 2014-05-19 NOTE — Telephone Encounter (Signed)
Called pharmacist and she states that Dr Freada BergeronLaura Phieffer has been refilling the stelara.  Pharmacist said that pt called them and told them that Dr Candis ShineVarney will be prescribing now.  Advised that I will double check with Dr Paulette BlanchVarmey.

## 2014-05-19 NOTE — Telephone Encounter (Signed)
Christine York from [facility] called to request a "new medication" for patient: Stelara 45mg  syringes. Medication found on meds list. Please call back to relay approved prescription, [phone], any operator can help.

## 2014-05-20 MED ORDER — USTEKINUMAB 45 MG/0.5 ML SUB-Q SYRINGE
45 mg/0.5 mL | Freq: Once | SUBCUTANEOUS | Status: AC
Start: 2014-05-20 — End: 2014-05-20

## 2014-05-28 ENCOUNTER — Encounter: Attending: Family Medicine | Primary: Family

## 2014-05-29 MED ORDER — FLUOCINONIDE 0.05 % OINTMENT
0.05 % | CUTANEOUS | Status: DC
Start: 2014-05-29 — End: 2014-07-16

## 2014-06-22 NOTE — Telephone Encounter (Signed)
Prior auth for Christine York was approved for 06/19/2014-12/19/2015, pharmacy notified .

## 2014-07-02 ENCOUNTER — Ambulatory Visit
Admit: 2014-07-02 | Discharge: 2014-07-02 | Payer: PRIVATE HEALTH INSURANCE | Attending: Family Medicine | Primary: Family

## 2014-07-02 DIAGNOSIS — R635 Abnormal weight gain: Secondary | ICD-10-CM

## 2014-07-02 NOTE — Patient Instructions (Signed)
Learning About Dietary Guidelines  What are the Dietary Guidelines for Americans?     Dietary Guidelines for Americans provide tips for eating well and staying healthy. This helps reduce the risk for long-term (chronic) diseases.  These adult guidelines from the [place] government recommend that you:  ?? Eat lots of fruits, vegetables, whole grains, and low-fat or nonfat dairy products.  ?? Try to balance your eating with your activity. This helps you stay at a healthy weight.  ?? Drink alcohol in moderation, if at all.  ?? Limit foods high in salt, saturated fat, trans fat, and added sugar.  What is MyPlate?  MyPlate is the [place] government's food guide. It can help you make your own well-balanced eating plan. A balanced eating plan means that you eat enough, but not too much, and that your food gives you the nutrients you need to stay healthy.  MyPlate focuses on eating plenty of whole grains, fruits, and vegetables, and on limiting fat and sugar. It is available online at www.ChooseMyPlate.gov.  How can you get started?  MyPlate suggests that most adults eat certain amounts from the different food groups:  Grains  Eat 5 to 8 ounces of grains each day. Half of those should be whole grains. Choose whole-grain breads, cold and cooked cereals and grains, pasta (without creamy sauces), hard rolls, or low-fat or fat-free crackers.  Vegetables  Eat 2 to 3 cups of vegetables every day. They contain little if any fat. And they have lots of nutrients that help protect against heart disease.  Fruits  Eat 1?? to 2 cups of fruits every day. Fruits contain very little fat but lots of nutrients.  Protein foods  Most adults need 5 to 6?? ounces each day. Choose fish and lean poultry more often. Eat red meat and fried meats less often. Dried beans, tofu, and nuts are also good sources of protein.  Dairy  Most adults need 3 cups of milk and milk products a day. Choose low-fat or  fat-free products from this food group. If you have problems digesting milk, try eating cheese or yogurt instead.  Limit fats and oils, including those used in cooking. When you do use fats, choose oils that are liquid at room temperature (unsaturated fats). These include canola oil and olive oil. Avoid foods with trans fats, such as many fried foods, cookies, and snack foods.  Where can you learn more?  Go to http://www.healthwise.net/[facility]  Enter D676 in the search box to learn more about "Learning About Dietary Guidelines."  ?? 2006-2016 Healthwise, Incorporated. Care instructions adapted under license by [employer] (which disclaims liability or warranty for this information). This care instruction is for use with your licensed healthcare professional. If you have questions about a medical condition or this instruction, always ask your healthcare professional. Healthwise, Incorporated disclaims any warranty or liability for your use of this information.  Content Version: 10.9.538570; Current as of: February 06, 2014

## 2014-07-02 NOTE — Progress Notes (Signed)
Chief Complaint   Patient presents with   ??? Weight Management     1. Have you been to the ER, urgent care clinic since your last visit?  Hospitalized since your last visit?No    2. Have you seen or consulted any other health care providers outside of the [facility] since your last visit?  Include any pap smears or colon screening. No

## 2014-07-02 NOTE — Progress Notes (Signed)
Chief Complaint   Patient presents with   ??? Weight Management     S:  1.  Glucose intolerance with abnormal weight gain; last seen 1 month ago on 04/30/14, plan at that time was to lose at least 7 lbs in last month, if she did not, then we would need to D/C the phentermine given the risk of medication without having continued benefit    Initially had side effect of tachycardia so I reduced her phentermine to 1/2 tab PO daily in 09/2013 and has not had tachycardia since that time.     Was on phentermine from 03/21/13 to 12/2013 and overall lost 30 lbs, but weight has stalled since that time    Wt Readings from Last 3 Encounters:   07/02/14 155 lb 6.4 oz (70.489 kg)   04/30/14 154 lb (69.854 kg)   03/11/14 152 lb 9.6 oz (69.219 kg)     Phentermine stopped from 12/2013 to 03/11/14, stopped phentermine since was no longer losing weight and was gaining weight on this medication and she regained 6 lbs.     She felt that the phentermine stopped working.      Given that phentermine was no longer working, I started trial of contrave on 12/2013 but she never filled it.     She wanted to go back on phentermine in 03/2014,, since she had  been off for a bit, hoping that she will benefit again, she is highly motivated, ready to start exercising now that good weather so it was restarted 03/11/14.    Since last visit, she has gained weight  Start weight=173     Recent labs (from 2015) reviewed today:   Lab Results  Component Value Date/Time   HEMOGLOBIN A1C 5.4 03/11/2014 09:10 AM   HEMOGLOBIN A1C 5.8 02/01/2013 08:09 AM   GLUCOSE 86 02/01/2013 08:09 AM   LDL, CALCULATED 118 03/11/2014 09:10 AM   CREATININE 0.78 02/01/2013 08:09 AM      Lab Results  Component Value Date/Time   CHOLESTEROL, TOTAL 206 03/11/2014 09:10 AM   HDL CHOLESTEROL 80 03/11/2014 09:10 AM   LDL, CALCULATED 118 03/11/2014 09:10 AM   TRIGLYCERIDE 41 03/11/2014 09:10 AM       Lab Results  Component Value Date/Time   TSH 0.879 02/01/2013 08:09 AM         Current BMI:    Body mass index is 29.38 kg/(m^2).    Denies adverse S/E from medication in the past including, no agitation, no mood change, no edema, no CP, SOB, no difficulty sleeping. No change in urine or bowel habits.     No changes in sexual function.     12 point ROS reviewed and otherwise negative unless stated above.     Past Medical History   Diagnosis Date   ??? Antiphospholipid syndrome (HCC)    ??? GERD (gastroesophageal reflux disease)    ??? Acid reflux    ??? Contact dermatitis and other eczema, due to unspecified cause    ??? Psoriasis    ??? Pregnancy      G5P1041; 4 SAB and 1 C/S       Current Outpatient Prescriptions   Medication Sig Dispense Refill   ??? Ustekinumab (STELARA) 45 mg/0.5 mL injection 45 mg by SubCUTAneous route every three (3) months.     ??? fluocinoNIDE (LIDEX) 0.05 % ointment APPLY EXTERNALLY TO THE AFFECTED AREA TWICE DAILY AS NEEDED 30 g 0   ??? phentermine (ADIPEX-P) 37.5 mg tablet Take 1 Tab  by mouth every morning. Max Daily Amount: 37.5 mg. 30 Tab 0   ??? cetirizine (ZYRTEC) 10 mg tablet Take  by mouth.     ??? tretinoin (RETIN-A) 0.05 % topical cream Apply  to affected area nightly. 20 g 0   ??? levonorgestrel (MIRENA) 20 mcg/24 hr (5 years) IUD 1 each by IntraUTERine route once.         Allergies   Allergen Reactions   ??? Bactrim [Sulfamethoprim Ds] Hives       History     Social History   ??? Marital Status: MARRIED     Spouse Name: N/A   ??? Number of Children: N/A   ??? Years of Education: N/A     Occupational History   ??? Not on file.     Social History Main Topics   ??? Smoking status: Never Smoker    ??? Smokeless tobacco: Never Used   ??? Alcohol Use: No   ??? Drug Use: No   ??? Sexual Activity:     Partners: Male     Pharmacist, hospital Protection: IUD     Other Topics Concern   ??? Not on file     Social History Narrative         OBJECTIVE:  Filed Vitals:    07/02/14 1045   BP: 116/76   Pulse: 75   Temp: 98.4 ??F (36.9 ??C)   TempSrc: Oral   Resp: 16   Height:  (1.549 m)   Weight: 155 lb 6.4 oz (70.489 kg)   SpO2: 100%        Gen: NAD  CV: BP well controlled, no tachycardia  Pulm: CTA bilat  Extrem: no edema    EKG: normal except for inverted precordial T waves, they flip and have good progression otherwise without other concerning findings.      A/P:    ICD-10-CM ICD-9-CM    1. Abnormal weight gain R63.5 783.1 REFERRAL TO WEIGHT LOSS given stalled weight loss and need for more intensive services           AMB POC EKG ROUTINE W/ 12 LEADS, INTER & REP       2. Overweight (BMI 25.0-29.9) E66.3 278.02 REFERRAL TO WEIGHT LOSS      AMB POC EKG ROUTINE W/ 12 LEADS, INTER & REP   3. Medication monitoring encounter Z51.81 V58.83 AMB POC EKG ROUTINE W/ 12 LEADS, INTER & REP       Sent lab, EKG and this note for Dr. Charlett Blake or his team to review at initial visit     Counseling:    Patient counseled on when to follow up, RTC sooner PRN for any new symptoms, concerns or possible medication S/E (of phentermine) continuing this medication without proven benefit.  Discussed with patient indication for return.      Time: 30 minutes was spent with this patient face to face discussing labs., EKG and referral services. Greater than 50% of this time was spent in counseling and coordination of care. Counseling specifics include: as above with diagnosis, treatment and other options for care and below education.     Discussed following with patient, that below can be used for weight loss to occur:    -Exercise, discussed FIT principal for training.  Discussed that on average only 2% of weight loss is contributed to exercise but still must be done given that those who kept weight off after losing, after 2 years, exercised the most.  Goal exercise is 60 minutes  MOST days of week (5-7 days per week) moderate intensity OR 150 minutes per week.     -Behavioral changes  -Diet modification      Follow-up Disposition:  Return in about 6 months (around 01/01/2015) for CPE, psorasis .      Dr. Orpah Melter,  D.O.  Physician

## 2014-07-16 NOTE — Telephone Encounter (Signed)
Contact # is [phone]      Patient is requesting a refill on medication:  Requested Prescriptions     Pending Prescriptions Disp Refills   ??? fluocinoNIDE (LIDEX) 0.05 % ointment 30 g 0     Pharmacy verified

## 2014-07-17 MED ORDER — FLUOCINONIDE 0.05 % OINTMENT
0.05 % | CUTANEOUS | Status: DC
Start: 2014-07-17 — End: 2014-09-01

## 2014-08-28 NOTE — Telephone Encounter (Signed)
Last office visit 06/2014, last labs 03/2014. Last fill 07/16/2014

## 2014-08-28 NOTE — Telephone Encounter (Signed)
Pharmacy on file verified

## 2014-09-01 ENCOUNTER — Ambulatory Visit: Admit: 2014-09-01 | Payer: PRIVATE HEALTH INSURANCE | Attending: Family Medicine | Primary: Family

## 2014-09-01 DIAGNOSIS — R635 Abnormal weight gain: Secondary | ICD-10-CM

## 2014-09-01 MED ORDER — FLUOCINONIDE 0.05 % OINTMENT
0.05 % | CUTANEOUS | 2 refills | Status: DC
Start: 2014-09-01 — End: 2015-01-14

## 2014-09-01 MED ORDER — NALTREXONE 8 MG-BUPROPION 90 MG TABLET,EXTENDED RELEASE
8-90 mg | ORAL_TABLET | ORAL | 0 refills | Status: DC
Start: 2014-09-01 — End: 2015-01-14

## 2014-09-01 NOTE — Progress Notes (Signed)
Chief Complaint   Patient presents with   ??? Weight Gain       1. Have you been to the ER, urgent care clinic since your last visit?  Hospitalized since your last visit?No    2. Have you seen or consulted any other health care providers outside of the [facility] since your last visit?  Include any pap smears or colon screening. No    Body mass index is 31.63 kg/(m^2).    SUBJECTIVE:    Patient here for follow up to abnormal weight gain    Reviewed last 3 weights:   Wt Readings from Last 3 Encounters:   09/01/14 167 lb 6.4 oz (75.9 kg)   07/02/14 155 lb 6.4 oz (70.5 kg)   04/30/14 154 lb (69.9 kg)

## 2014-09-01 NOTE — Patient Instructions (Signed)
Learning About Dietary Guidelines  What are the Dietary Guidelines for Americans?     Dietary Guidelines for Americans provide tips for eating well and staying healthy. This helps reduce the risk for long-term (chronic) diseases.  These adult guidelines from the [place] government recommend that you:  ?? Eat lots of fruits, vegetables, whole grains, and low-fat or nonfat dairy products.  ?? Try to balance your eating with your activity. This helps you stay at a healthy weight.  ?? Drink alcohol in moderation, if at all.  ?? Limit foods high in salt, saturated fat, trans fat, and added sugar.  What is MyPlate?  MyPlate is the [place] government's food guide. It can help you make your own well-balanced eating plan. A balanced eating plan means that you eat enough, but not too much, and that your food gives you the nutrients you need to stay healthy.  MyPlate focuses on eating plenty of whole grains, fruits, and vegetables, and on limiting fat and sugar. It is available online at www.ChooseMyPlate.gov.  How can you get started?  MyPlate suggests that most adults eat certain amounts from the different food groups:  Grains  Eat 5 to 8 ounces of grains each day. Half of those should be whole grains. Choose whole-grain breads, cold and cooked cereals and grains, pasta (without creamy sauces), hard rolls, or low-fat or fat-free crackers.  Vegetables  Eat 2 to 3 cups of vegetables every day. They contain little if any fat. And they have lots of nutrients that help protect against heart disease.  Fruits  Eat 1?? to 2 cups of fruits every day. Fruits contain very little fat but lots of nutrients.  Protein foods  Most adults need 5 to 6?? ounces each day. Choose fish and lean poultry more often. Eat red meat and fried meats less often. Dried beans, tofu, and nuts are also good sources of protein.  Dairy  Most adults need 3 cups of milk and milk products a day. Choose low-fat or  fat-free products from this food group. If you have problems digesting milk, try eating cheese or yogurt instead.  Limit fats and oils, including those used in cooking. When you do use fats, choose oils that are liquid at room temperature (unsaturated fats). These include canola oil and olive oil. Avoid foods with trans fats, such as many fried foods, cookies, and snack foods.  Where can you learn more?  Go to http://www.healthwise.net/[facility]  Enter D676 in the search box to learn more about "Learning About Dietary Guidelines."  ?? 2006-2016 Healthwise, Incorporated. Care instructions adapted under license by [employer] (which disclaims liability or warranty for this information). This care instruction is for use with your licensed healthcare professional. If you have questions about a medical condition or this instruction, always ask your healthcare professional. Healthwise, Incorporated disclaims any warranty or liability for your use of this information.  Content Version: 10.9.538570; Current as of: February 06, 2014

## 2014-09-01 NOTE — Progress Notes (Signed)
Chief Complaint   Patient presents with   ??? Weight Gain     S:  1.  Glucose intolerance with abnormal weight gain; her condition is worsening since last visit     seen on 04/30/14, plan at that time was to lose at least 7 lbs in last month, if she did not, then we would need to D/C the phentermine given the risk of medication without having continued benefit    Initially had side effect of tachycardia so I reduced her phentermine to 1/2 tab PO daily in 09/2013 and has not had tachycardia since that time.     Was on phentermine from 03/21/13 to 12/2013 and overall lost 30 lbs, but weight has stalled since that time    Wt Readings from Last 3 Encounters:   09/01/14 167 lb 6.4 oz (75.9 kg)   07/02/14 155 lb 6.4 oz (70.5 kg)   04/30/14 154 lb (69.9 kg)     Phentermine stopped from 12/2013 to 03/11/14, stopped phentermine since was no longer losing weight and was gaining weight on this medication and she regained 6 lbs.     She felt that the phentermine stopped working.      Given that phentermine was no longer working, I started trial of contrave on 12/2013 but she never filled it.     She wanted to go back on phentermine in 03/2014,, since she had  been off for a bit, hoping that she will benefit again, she is highly motivated, ready to start exercising now that good weather so it was restarted 03/11/14.    After restarting medication, she never again lost weight, she has only gained weight so at last visit on 07/02/14, I referred her to [facility]    Since last visit, she has gained weight  Start weight=173     Recent labs (from 2015) reviewed today:   Lab Results   Component Value Date/Time    HEMOGLOBIN A1C 5.4 03/11/2014 09:10 AM    HEMOGLOBIN A1C 5.8 02/01/2013 08:09 AM    GLUCOSE 86 02/01/2013 08:09 AM    LDL, CALCULATED 118 03/11/2014 09:10 AM    CREATININE 0.78 02/01/2013 08:09 AM      Lab Results   Component Value Date/Time    CHOLESTEROL, TOTAL 206 03/11/2014 09:10 AM     HDL CHOLESTEROL 80 03/11/2014 09:10 AM    LDL, CALCULATED 118 03/11/2014 09:10 AM    TRIGLYCERIDE 41 03/11/2014 09:10 AM       Lab Results   Component Value Date/Time    TSH 0.879 02/01/2013 08:09 AM         Current BMI:   Body mass index is 31.63 kg/(m^2).     2.  Also needs refill of lidex.         12 point ROS reviewed and otherwise negative unless stated above.     Past Medical History   Diagnosis Date   ??? Acid reflux    ??? Antiphospholipid syndrome (HCC)    ??? Contact dermatitis and other eczema, due to unspecified cause    ??? GERD (gastroesophageal reflux disease)    ??? Pregnancy      G5P1041; 4 SAB and 1 C/S   ??? Psoriasis        Current Outpatient Prescriptions   Medication Sig Dispense Refill   ??? Ustekinumab (STELARA) 45 mg/0.5 mL injection 45 mg by SubCUTAneous route every three (3) months.     ??? tretinoin (RETIN-A) 0.05 % topical  cream Apply  to affected area nightly. 20 g 0   ??? levonorgestrel (MIRENA) 20 mcg/24 hr (5 years) IUD 1 each by IntraUTERine route once.     ??? fluocinoNIDE (LIDEX) 0.05 % ointment APPLY EXTERNALLY TO THE AFFECTED AREA TWICE DAILY AS NEEDED 30 g 2       Allergies   Allergen Reactions   ??? Bactrim [Sulfamethoprim Ds] Hives       Social History     Social History   ??? Marital status: MARRIED     Spouse name: N/A   ??? Number of children: N/A   ??? Years of education: N/A     Occupational History   ??? Not on file.     Social History Main Topics   ??? Smoking status: Never Smoker   ??? Smokeless tobacco: Never Used   ??? Alcohol use No   ??? Drug use: No   ??? Sexual activity: Yes     Partners: Male     Birth control/ protection: IUD     Other Topics Concern   ??? Not on file     Social History Narrative         OBJECTIVE:  Vitals:    09/01/14 1754   BP: 110/75   Pulse: 90   Resp: 14   Temp: 98.6 ??F (37 ??C)   TempSrc: Oral   SpO2: 100%   Weight: 167 lb 6.4 oz (75.9 kg)   Height:  (1.549 m)       Gen: NAD  CV: BP well controlled, no tachycardia  Pulm: CTA bilat  Extrem: no edema        A/P:       ICD-10-CM ICD-9-CM    1. Abnormal weight gain R63.5 783.1 naltrexone-buPROPion (CONTRAVE) 8-90 mg TbER ER tablet; dicussed dosing with patient     Call before stopping medication     Patient counseled on when to follow up, RTC sooner PRN for any new symptoms, concerns or possible medication S/E.  Discussed with patient indication for return.  Reviewed medications today, possible side effects of medications, reasons for discontinuation and reasons to seek immediate medical care including ER for clinic after hours care and on call physician for our clinic.      Has had success with phentermine in the past but recent efforts stalled.      2. Tachycardia R00.0 785.0 Will need to watch this carefully while on contrave        3. Psoriasis L40.9 696.1 fluocinoNIDE (LIDEX) 0.05 % ointment BID PRN small dose        Time:25 minutes was spent with this patient face to face discussing weight loss options and medications and below education. Greater than 50% of this time was spent in counseling and coordination of care. Counseling specifics include: as above with diagnosis, treatment and other options for care and below education.     Discussed following with patient, that below can be used for weight loss to occur:    -Exercise, discussed FIT principal for training.  Discussed that on average only 2% of weight loss is contributed to exercise but still must be done given that those who kept weight off after losing, after 2 years, exercised the most.  Goal exercise is 60 minutes MOST days of week (5-7 days per week) moderate intensity.   -Behavioral changes, possible referral to dietician at next visit  -Diet modification  -Medications; discussed action, most common side effects of the following FDA approved medications  for weight loss.  Contrave, Orlistat, Phentermine-Topirimate ER, Locaserin, phentermine and the non FDA approved for weight loss, metformin.  Discussed average weight loss with each.  Discussed seratonin syndrome and how NO documented cases with phentermine and SSRI use and that this is a theoretical risk.  Reviewed signs of seratonin syndrome and gave hand out on these side effects to watch for.      Based on above, patient would like to trial: contrave     Goal weight loss for next 3 months; 5% of current weight     Discussed risk of obesity and BMI >30 and risk factors for other diseases if weight loss not achieved.     Discussed cost of medication with patient, savings card discussed today      Follow-up Disposition:  Return in about 4 weeks (around 09/29/2014) for Weight management .    Dr. Orpah Melteratherine Kemarion Abbey,  D.O.  Physician

## 2014-09-03 MED ORDER — NALTREXONE 8 MG-BUPROPION 90 MG TABLET,EXTENDED RELEASE
8-90 mg | ORAL_TABLET | Freq: Two times a day (BID) | ORAL | 0 refills | Status: AC
Start: 2014-09-03 — End: 2014-10-03

## 2014-09-03 NOTE — Telephone Encounter (Signed)
Contact # is [phone]    [facility] is calling in regards to patient's "Contrave" prescription. Representative states that they would need a new new prescription with the regular dose for medication in order for patient to use the discount card.

## 2014-09-03 NOTE — Telephone Encounter (Signed)
Contact # is [phone]    Patient states that Dr Candis Shine told her the "Naltrexone" prescription shouldn't be more than $30 or so, however, when she went to the pharmacy it was over $100. She is wondering is there a discount card or something she can get

## 2014-09-03 NOTE — Telephone Encounter (Signed)
Sent in

## 2015-01-14 ENCOUNTER — Ambulatory Visit: Admit: 2015-01-14 | Discharge: 2015-01-14 | Attending: Family Medicine | Primary: Family

## 2015-01-14 DIAGNOSIS — R635 Abnormal weight gain: Secondary | ICD-10-CM

## 2015-01-14 MED ORDER — FLUOCINONIDE 0.05 % OINTMENT
0.05 % | CUTANEOUS | 2 refills | Status: DC
Start: 2015-01-14 — End: 2015-07-08

## 2015-01-14 MED ORDER — NALTREXONE 8 MG-BUPROPION 90 MG TABLET,EXTENDED RELEASE
8-90 mg | ORAL_TABLET | Freq: Two times a day (BID) | ORAL | 0 refills | Status: DC
Start: 2015-01-14 — End: 2015-03-23

## 2015-01-14 MED ORDER — TRETINOIN 0.05 % TOPICAL CREAM
0.05 % | CUTANEOUS | 1 refills | Status: DC
Start: 2015-01-14 — End: 2015-07-12

## 2015-01-14 NOTE — Progress Notes (Signed)
Chief Complaint   Patient presents with   ??? Weight Management       1. Have you been to the ER, urgent care clinic since your last visit?  Hospitalized since your last visit?No    2. Have you seen or consulted any other health care providers outside of the [facility] since your last visit?  Include any pap smears or colon screening. No    Body mass index is 33.9 kg/(m^2).    SUBJECTIVE:    Patient here for follow up to abnormal weight gain    Current medication for this condition: Contrave    Side effects of medication reported by patient: None    ROS:  Denies adverse S/E from medication, no agitation, no mood change, no edema, no CP, SOB, no difficulty sleeping.  No change in urine or bowel habits.     Reviewed last 3 weights:   Wt Readings from Last 3 Encounters:   01/14/15 179 lb 6.4 oz (81.4 kg)   09/01/14 167 lb 6.4 oz (75.9 kg)   07/02/14 155 lb 6.4 oz (70.5 kg)

## 2015-01-14 NOTE — Patient Instructions (Signed)
Learning About Dietary Guidelines  What are the Dietary Guidelines for Americans?    Dietary Guidelines for Americans provide tips for eating well and staying healthy. This helps reduce the risk for long-term (chronic) diseases.  These adult guidelines from the [place] government recommend that you:  ?? Eat lots of fruits, vegetables, whole grains, and low-fat or nonfat dairy products.  ?? Try to balance your eating with your activity. This helps you stay at a healthy weight.  ?? Drink alcohol in moderation, if at all.  ?? Limit foods high in salt, saturated fat, trans fat, and added sugar.  What is MyPlate?  MyPlate is the [place] government's food guide. It can help you make your own well-balanced eating plan. A balanced eating plan means that you eat enough, but not too much, and that your food gives you the nutrients you need to stay healthy.  MyPlate focuses on eating plenty of whole grains, fruits, and vegetables, and on limiting fat and sugar. It is available online at www.ChooseMyPlate.gov.  How can you get started?  MyPlate suggests that most adults eat certain amounts from the different food groups:  Grains  Eat 5 to 8 ounces of grains each day. Half of those should be whole grains. Choose whole-grain breads, cold and cooked cereals and grains, pasta (without creamy sauces), hard rolls, or low-fat or fat-free crackers.  Vegetables  Eat 2 to 3 cups of vegetables every day. They contain little if any fat. And they have lots of nutrients that help protect against heart disease.  Fruits  Eat 1?? to 2 cups of fruits every day. Fruits contain very little fat but lots of nutrients.  Protein foods  Most adults need 5 to 6?? ounces each day. Choose fish and lean poultry more often. Eat red meat and fried meats less often. Dried beans, tofu, and nuts are also good sources of protein.  Dairy  Most adults need 3 cups of milk and milk products a day. Choose low-fat or  fat-free products from this food group. If you have problems digesting milk, try eating cheese or yogurt instead.  Limit fats and oils, including those used in cooking. When you do use fats, choose oils that are liquid at room temperature (unsaturated fats). These include canola oil and olive oil. Avoid foods with trans fats, such as many fried foods, cookies, and snack foods.  Where can you learn more?  Go to http://www.healthwise.net/GoodHelpConnections.  Enter D676 in the search box to learn more about "Learning About Dietary Guidelines."  Current as of: July 28, 2014  Content Version: 11.1  ?? 2006-2016 [employer]. Care instructions adapted under license by [employer] (which disclaims liability or warranty for this information). If you have questions about a medical condition or this instruction, always ask your healthcare professional. Healthwise[employer] disclaims any warranty or liability for your use of this information.

## 2015-01-14 NOTE — Progress Notes (Signed)
Chief Complaint   Patient presents with   ??? Weight Management     S:  1.  Glucose intolerance with abnormal weight gain; her condition is worsening since last visit     Patient previously on phentermine, started on 03/21/13    Was on phentermine from 03/21/13 to 12/2013 and overall lost 30 lbs, but weight stalled around 04/2014    Phentermine stopped from 12/2013 to 03/11/14, stopped phentermine since was no longer losing weight and was gaining weight on this medication and she regained 6 lbs.     Given that phentermine was no longer working, I started trial of contrave on 12/2013 but she never filled it.    She wanted to go back on phentermine in 03/2014,, since she had  been off for a bit, hoping that she will benefit again, she was highly motivated, ready to start exercising now that good weather so it was restarted 03/11/14.    Seen on 04/30/14 (plan on 03/11/14 after restarting was to lose at least 7 lbs in one month) but she did not reach this goal so we would need to D/C the phentermine given the risk of medication without having continued benefit.    After restarting medication, she never again lost weight, so 07/02/14, I referred her to [facility]    Was previously on contrave and lost weight but has been off for about 4 months but would like to restart     Wt Readings from Last 3 Encounters:   01/14/15 179 lb 6.4 oz (81.4 kg)   09/01/14 167 lb 6.4 oz (75.9 kg)   07/02/14 155 lb 6.4 oz (70.5 kg)       Recent labs (from 2015) reviewed today:   Lab Results   Component Value Date/Time    Hemoglobin A1c 5.4 03/11/2014 09:10 AM    Hemoglobin A1c 5.8 02/01/2013 08:09 AM    Glucose 86 02/01/2013 08:09 AM    LDL, calculated 118 03/11/2014 09:10 AM    Creatinine 0.78 02/01/2013 08:09 AM      Lab Results   Component Value Date/Time    Cholesterol, total 206 03/11/2014 09:10 AM    HDL Cholesterol 80 03/11/2014 09:10 AM    LDL, calculated 118 03/11/2014 09:10 AM    Triglyceride 41 03/11/2014 09:10 AM        Lab Results   Component Value Date/Time    TSH 0.879 02/01/2013 08:09 AM         Current BMI:   Body mass index is 33.9 kg/(m^2).     2/3.  History of psoriasis and milaria    Needs refill of lidex and retin a, respectively for these conditions.     Conditions stable.     12 point ROS reviewed and otherwise negative unless stated above.     Past Medical History   Diagnosis Date   ??? Acid reflux    ??? Antiphospholipid syndrome (HCC)    ??? Contact dermatitis and other eczema, due to unspecified cause    ??? GERD (gastroesophageal reflux disease)    ??? Pregnancy      G5P1041; 4 SAB and 1 C/S   ??? Psoriasis        Current Outpatient Prescriptions   Medication Sig Dispense Refill   ??? fluocinoNIDE (LIDEX) 0.05 % ointment APPLY EXTERNALLY TO THE AFFECTED AREA TWICE DAILY AS NEEDED 60 g 2   ??? tretinoin (RETIN-A) 0.05 % topical cream Apply  to affected area nightly. 20 g  0   ??? levonorgestrel (MIRENA) 20 mcg/24 hr (5 years) IUD 1 each by IntraUTERine route once.     ??? Ustekinumab (STELARA) 45 mg/0.5 mL injection 45 mg by SubCUTAneous route every three (3) months.         Allergies   Allergen Reactions   ??? Bactrim [Sulfamethoprim Ds] Hives       Social History     Social History   ??? Marital status: MARRIED     Spouse name: N/A   ??? Number of children: N/A   ??? Years of education: N/A     Occupational History   ??? Not on file.     Social History Main Topics   ??? Smoking status: Never Smoker   ??? Smokeless tobacco: Never Used   ??? Alcohol use No   ??? Drug use: No   ??? Sexual activity: Yes     Partners: Male     Birth control/ protection: IUD     Other Topics Concern   ??? Not on file     Social History Narrative         OBJECTIVE:  Vitals:    01/14/15 1026   BP: 120/77   Pulse: 73   Resp: 14   Temp: 98.6 ??F (37 ??C)   TempSrc: Oral   SpO2: 99%   Weight: 179 lb 6.4 oz (81.4 kg)   Height: 5\' 1"  (1.549 m)       Gen: NAD  CV: BP well controlled, no tachycardia  Pulm: CTA bilat  Extrem: no edema        A/P:      ICD-10-CM ICD-9-CM     1. Abnormal weight gain R63.5 783.1 naltrexone-buPROPion (CONTRAVE) 8-90 mg TbER ER tablet   2. 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 Psoriasis L40.9 696.1 fluocinoNIDE (LIDEX) 0.05 % ointment       Time:25 minutes was spent with this patient face to face discussing weight loss options and medications and below education. Greater than 50% of this time was spent in counseling and coordination of care. Counseling specifics include: as above with diagnosis, treatment and other options for care and below education.     Reiterated multiple times to patient that there is no "magic pill" and that she will absolutely have to use medication in conjunction with below intervention in order to see results.      Discussed following with patient, that below can be used for weight loss to occur:    -Exercise, discussed FIT principal for training.  Discussed that on average only 2% of weight loss is contributed to exercise but still must be done given that those who kept weight off after losing, after 2 years, exercised the most.  Goal exercise is 60 minutes MOST days of week (5-7 days per week) moderate intensity.   -Behavioral changes  -Diet modification  -Medications; discussed action, most common side effects of the following FDA approved medications for weight loss.  Contrave, Orlistat, Phentermine-Topirimate ER, Locaserin, phentermine and the non FDA approved for weight loss, metformin.  Discussed average weight loss with each. Discussed seratonin syndrome and how NO documented cases with phentermine and SSRI use and that this is a theoretical risk.  Reviewed signs of seratonin syndrome and gave hand out on these side effects to watch for.      Based on above, patient would like to trial: contrave       Discussed risk of obesity and BMI >30  and risk factors for other diseases if weight loss not achieved.     Discussed cost of medication with patient, savings card discussed today     Follow-up Disposition:  Return in about 4 weeks (around 02/11/2015) for weight mangement.   Goal weight loss for next 3 months; 5% of current weight       Dr. Orpah Melter,  D.O.  Physician

## 2015-02-04 NOTE — Telephone Encounter (Signed)
PC she states that she is taking contrave and is having body aches with h/a.  She thinks she has a fever but hasn't taken it.  She has just a little cough but not anything major.  Her main thing is the body aches and h/a.  Advised that she should be seen.  She hasn't taken anything not even tylenol or advil.  She just wants to know if ok to take either the tylenol or advil.  Advised that is fine. She should f/u [facility] in a couple of days or worsen sx's.  Pt states understanding and she will do that.

## 2015-02-04 NOTE — Telephone Encounter (Signed)
Christine York, Christine York     -       [phone]    -  Former Varney patient states she has a cold and want to know if you prescribe  something- Advised patient she may have to schedule an appt - Waiting for call back-

## 2015-02-05 ENCOUNTER — Encounter: Attending: Family Medicine | Primary: Family

## 2015-03-23 ENCOUNTER — Ambulatory Visit: Admit: 2015-03-23 | Discharge: 2015-03-23 | Attending: Family | Primary: Family

## 2015-03-23 DIAGNOSIS — R5383 Other fatigue: Secondary | ICD-10-CM

## 2015-03-23 MED ORDER — PHENTERMINE 37.5 MG TAB
37.5 mg | ORAL_TABLET | ORAL | 0 refills | Status: DC
Start: 2015-03-23 — End: 2015-04-19

## 2015-03-23 MED ORDER — FLUOCINONIDE 0.05 % OINTMENT
0.05 % | Freq: Two times a day (BID) | CUTANEOUS | 0 refills | Status: DC
Start: 2015-03-23 — End: 2015-03-23

## 2015-03-23 MED ORDER — FLUOCINONIDE 0.05 % OINTMENT
0.05 % | Freq: Two times a day (BID) | CUTANEOUS | 0 refills | Status: DC
Start: 2015-03-23 — End: 2015-07-08

## 2015-03-23 NOTE — Progress Notes (Signed)
"  Reviewed record in preparation for visit and have obtained the necessary documentation"  Chief Complaint   Patient presents with   ??? Weight Management     follow up    ??? Psoriasis     follow up          Patient presents in the office today for a follow up of weight management and psoriasis     Patient previously under the care of Dr.Varney being treated with Contrave     Patient advises she does not feel the Contrave has been effective, patient previously took Phentermine and feels that medication was more effective in weight management     Patient would also like to continue treatment of Stelara injections for treatment of psoriasis     PHQ 2 / 9, over the last two weeks 03/23/2015   Little interest or pleasure in doing things Not at all   Feeling down, depressed or hopeless Not at all   Total Score PHQ 2 0         1. Have you been to the ER, urgent care clinic since your last visit?  Hospitalized since your last visit?No    2. Have you seen or consulted any other health care providers outside of the [facility] since your last visit?  Include any pap smears or colon screening. No

## 2015-03-23 NOTE — Progress Notes (Signed)
HISTORY OF PRESENT ILLNESS  Christine York is a 40 y.o. female.  HPI: Patient comes in and would like to get on phentermine to lose weight. She took Contrave without much help. She exercises daily and watches her diet but her biggest problem is that she likes to eat sweats. She also C/O fatigue and believes it is due to being obese.   She has psoriases and her dermatologist ordered her Stelara, and showed her how to inject it to herself. Later Dr Candis Shine was giving her the medication and she was self injecting. She is requesting refill on that.   Past Medical History:   Diagnosis Date   ??? Acid reflux    ??? Antiphospholipid syndrome (HCC)    ??? Contact dermatitis and other eczema, due to unspecified cause    ??? GERD (gastroesophageal reflux disease)    ??? Pregnancy     G5P1041; 4 SAB and 1 C/S   ??? Psoriasis      Past Surgical History:   Procedure Laterality Date   ??? EXTRACTION ERUPTED TOOTH/EXR     ??? HX WISDOM TEETH EXTRACTION Bilateral      Allergies   Allergen Reactions   ??? Bactrim [Sulfamethoprim Ds] Hives     Current Outpatient Prescriptions:   ???  phentermine (ADIPEX-P) 37.5 mg tablet, Take 1 Tab by mouth every morning. Max Daily Amount: 37.5 mg., Disp: 30 Tab, Rfl: 0  ???  fluocinoNIDE (LIDEX) 0.05 % ointment, Apply  to affected area two (2) times a day., Disp: 45 g, Rfl: 0  ???  fluocinoNIDE (LIDEX) 0.05 % ointment, APPLY EXTERNALLY TO THE AFFECTED AREA TWICE DAILY AS NEEDED, Disp: 60 g, Rfl: 2  ???  tretinoin (RETIN-A) 0.05 % topical cream, Apply to affected area nightly, Disp: 20 g, Rfl: 1  ???  levonorgestrel (MIRENA) 20 mcg/24 hr (5 years) IUD, 1 each by IntraUTERine route once., Disp: , Rfl:   ???  Ustekinumab (STELARA) 45 mg/0.5 mL injection, 45 mg by SubCUTAneous route every three (3) months., Disp: , Rfl:   Review of Systems   Constitutional: Negative.    Respiratory: Negative.    Cardiovascular: Negative.    Gastrointestinal: Negative.    Skin: Positive for rash.    Blood pressure 118/78, pulse 79, temperature 97.7 ??F (36.5 ??C), temperature source Oral, resp. rate 18, height  (1.549 m), weight 174 lb 9.6 oz (79.2 kg), SpO2 98 %.    Physical Exam   Constitutional: No distress.   HENT:   Mouth/Throat: Oropharynx is clear and moist.   Neck: Neck supple.   Cardiovascular: Normal rate and regular rhythm.    No murmur heard.  Pulmonary/Chest: Effort normal and breath sounds normal.   Abdominal: Soft. Bowel sounds are normal.   Skin: Rash noted.   Small patches of erythematous rash on her upper arms   Nursing note and vitals reviewed.      ASSESSMENT and PLAN    ICD-10-CM ICD-9-CM    1. Other fatigue R53.83 780.79 CBC WITH AUTOMATED DIFF      TSH 3RD GENERATION      METABOLIC PANEL, COMPREHENSIVE   2. Glucose intolerance (impaired glucose tolerance) R73.02 790.22 HEMOGLOBIN A1C WITH EAG   3. Over weight E66.3 278.02 phentermine (ADIPEX-P) 37.5 mg tablet   4. Psoriasis L40.9 696.1 fluocinoNIDE (LIDEX) 0.05 % ointment         Advised to continue with diet and exercise  Will take phentermine for three months only  Advised to use lidex on  her rash and follow up with her dermatologist  Pt was given an after visit summary which includes diagnosis, current medicines and vital and voiced understanding of treatment plan

## 2015-03-24 LAB — CBC WITH AUTOMATED DIFF
ABS. BASOPHILS: 0 10*3/uL (ref 0.0–0.2)
ABS. EOSINOPHILS: 0.2 10*3/uL (ref 0.0–0.4)
ABS. IMM. GRANS.: 0 10*3/uL (ref 0.0–0.1)
ABS. MONOCYTES: 0.6 10*3/uL (ref 0.1–0.9)
ABS. NEUTROPHILS: 4.7 10*3/uL (ref 1.4–7.0)
Abs Lymphocytes: 2.3 10*3/uL (ref 0.7–3.1)
BASOPHILS: 0 %
EOSINOPHILS: 2 %
HCT: 40.2 % (ref 34.0–46.6)
HGB: 13.2 g/dL (ref 11.1–15.9)
IMMATURE GRANULOCYTES: 0 %
Lymphocytes: 29 %
MCH: 29.4 pg (ref 26.6–33.0)
MCHC: 32.8 g/dL (ref 31.5–35.7)
MCV: 90 fL (ref 79–97)
MONOCYTES: 8 %
NEUTROPHILS: 61 %
PLATELET: 343 10*3/uL (ref 150–379)
RBC: 4.49 x10E6/uL (ref 3.77–5.28)
RDW: 13.1 % (ref 12.3–15.4)
WBC: 7.8 10*3/uL (ref 3.4–10.8)

## 2015-03-24 LAB — METABOLIC PANEL, COMPREHENSIVE
A-G Ratio: 1.6 (ref 1.2–2.2)
ALT (SGPT): 10 IU/L (ref 0–32)
AST (SGOT): 14 IU/L (ref 0–40)
Albumin: 4.4 g/dL (ref 3.5–5.5)
Alk. phosphatase: 52 IU/L (ref 39–117)
BUN/Creatinine ratio: 13 (ref 9–23)
BUN: 10 mg/dL (ref 6–24)
Bilirubin, total: 0.2 mg/dL (ref 0.0–1.2)
CO2: 25 mmol/L (ref 18–29)
Calcium: 9.1 mg/dL (ref 8.7–10.2)
Chloride: 101 mmol/L (ref 96–106)
Creatinine: 0.77 mg/dL (ref 0.57–1.00)
GFR est AA: 111 mL/min/{1.73_m2} (ref >59)
GFR est non-AA: 96 mL/min/{1.73_m2} (ref >59)
GLOBULIN, TOTAL: 2.7 g/dL (ref 1.5–4.5)
Glucose: 88 mg/dL (ref 65–99)
Potassium: 5.2 mmol/L (ref 3.5–5.2)
Protein, total: 7.1 g/dL (ref 6.0–8.5)
Sodium: 142 mmol/L (ref 134–144)

## 2015-03-24 LAB — HEMOGLOBIN A1C WITH EAG
Estimated average glucose: 105 mg/dL
Hemoglobin A1c: 5.3 % (ref 4.8–5.6)

## 2015-03-24 LAB — TSH 3RD GENERATION: TSH: 1.16 u[IU]/mL (ref 0.450–4.500)

## 2015-04-19 ENCOUNTER — Encounter

## 2015-04-19 NOTE — Telephone Encounter (Signed)
Error

## 2015-04-19 NOTE — Telephone Encounter (Signed)
Contact # is [phone]    Patient is requesting a refill on medication:  Requested Prescriptions     Pending Prescriptions Disp Refills   ??? phentermine (ADIPEX-P) 37.5 mg tablet 30 Tab 0     Sig: Take 1 Tab by mouth every morning. Max Daily Amount: 37.5 mg.     Pharmacy verified

## 2015-04-20 MED ORDER — PHENTERMINE 37.5 MG TAB
37.5 mg | ORAL_TABLET | ORAL | 0 refills | Status: DC
Start: 2015-04-20 — End: 2015-05-21

## 2015-04-20 NOTE — Telephone Encounter (Signed)
Rx for Adipex-P has been called into [facility].

## 2015-05-21 ENCOUNTER — Encounter

## 2015-05-21 NOTE — Telephone Encounter (Signed)
Last office visit and labs 03/2015

## 2015-05-21 NOTE — Telephone Encounter (Signed)
Pharmacy on file verified   *patient is requesting that this medication be called into the [facility] pharmacy on file*

## 2015-05-22 ENCOUNTER — Encounter

## 2015-05-24 MED ORDER — PHENTERMINE 37.5 MG TAB
37.5 mg | ORAL_TABLET | ORAL | 0 refills | Status: DC
Start: 2015-05-24 — End: 2015-09-02

## 2015-05-24 NOTE — Telephone Encounter (Signed)
Rx called in to local pharmacy on file.

## 2015-06-11 ENCOUNTER — Emergency Department (HOSPITAL_COMMUNITY)
Admission: EM | Admit: 2015-06-11 | Discharge: 2015-06-11 | Disposition: A | Discharge status: Home / Self Care | Payer: No Typology Code available for payment source | Attending: Emergency Medicine | Admitting: Emergency Medicine

## 2015-06-11 ENCOUNTER — Encounter (HOSPITAL_COMMUNITY): Payer: Self-pay | Admitting: Nurse Practitioner

## 2015-06-11 ENCOUNTER — Emergency Department (HOSPITAL_COMMUNITY): Payer: No Typology Code available for payment source

## 2015-06-11 ENCOUNTER — Encounter: Attending: Family | Primary: Family

## 2015-06-11 DIAGNOSIS — Y939 Activity, unspecified: Secondary | ICD-10-CM | POA: Diagnosis not present

## 2015-06-11 DIAGNOSIS — M542 Cervicalgia: Secondary | ICD-10-CM | POA: Diagnosis not present

## 2015-06-11 DIAGNOSIS — Y999 Unspecified external cause status: Secondary | ICD-10-CM | POA: Diagnosis not present

## 2015-06-11 DIAGNOSIS — M79631 Pain in right forearm: Secondary | ICD-10-CM | POA: Diagnosis not present

## 2015-06-11 DIAGNOSIS — Y92410 Unspecified street and highway as the place of occurrence of the external cause: Secondary | ICD-10-CM | POA: Diagnosis not present

## 2015-06-11 DIAGNOSIS — M546 Pain in thoracic spine: Secondary | ICD-10-CM

## 2015-06-11 DIAGNOSIS — M62830 Muscle spasm of back: Secondary | ICD-10-CM | POA: Diagnosis not present

## 2015-06-11 DIAGNOSIS — S199XXA Unspecified injury of neck, initial encounter: Secondary | ICD-10-CM | POA: Diagnosis present

## 2015-06-11 MED ORDER — HYDROCODONE-ACETAMINOPHEN 5-325 MG PO TABS: 1.0000 | ORAL_TABLET | Freq: Four times a day (QID) | ORAL | Status: AC | PRN

## 2015-06-11 MED ORDER — NAPROXEN 500 MG PO TABS: 500.0000 mg | ORAL_TABLET | Freq: Two times a day (BID) | ORAL | Status: AC | PRN

## 2015-06-11 MED ORDER — ONDANSETRON 8 MG PO TBDP
8.0000 mg | ORAL_TABLET | Freq: Once | ORAL | Status: AC
Start: 2015-06-11 — End: 2015-06-11
  Administered 2015-06-11: 8 mg via ORAL
  Filled 2015-06-11: qty 1

## 2015-06-11 MED ORDER — CYCLOBENZAPRINE HCL 10 MG PO TABS: 10.0000 mg | ORAL_TABLET | Freq: Three times a day (TID) | ORAL | Status: AC | PRN

## 2015-06-11 MED ORDER — IBUPROFEN 200 MG PO TABS
600.0000 mg | ORAL_TABLET | Freq: Once | ORAL | Status: AC
  Administered 2015-06-11: 600 mg via ORAL
  Filled 2015-06-11: qty 3
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
## 2015-06-11 NOTE — ED Notes (Signed)
Patient transported to X-ray 
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
## 2015-06-18 ENCOUNTER — Ambulatory Visit: Admit: 2015-06-18 | Attending: Family Medicine | Primary: Family

## 2015-06-18 MED ORDER — ONDANSETRON 4 MG TAB, RAPID DISSOLVE
4 mg | ORAL_TABLET | Freq: Three times a day (TID) | ORAL | 1 refills | Status: AC | PRN
Start: 2015-06-18 — End: ?

## 2015-06-18 MED ORDER — NORTRIPTYLINE 10 MG CAP
10 mg | ORAL_CAPSULE | Freq: Every evening | ORAL | 1 refills | Status: DC
Start: 2015-06-18 — End: 2015-07-12

## 2015-06-18 NOTE — Patient Instructions (Signed)
Put the phentermine on hold until your symptoms are improving. We can discuss this at 2 week follow up.
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
## 2015-06-24 ENCOUNTER — Inpatient Hospital Stay
Admit: 2015-06-24 | Payer: PRIVATE HEALTH INSURANCE | Attending: Rehabilitative and Restorative Service Providers" | Primary: Family

## 2015-06-24 DIAGNOSIS — M542 Cervicalgia: Secondary | ICD-10-CM
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
## 2015-07-01 ENCOUNTER — Inpatient Hospital Stay
Admit: 2015-07-01 | Payer: PRIVATE HEALTH INSURANCE | Attending: Rehabilitative and Restorative Service Providers" | Primary: Family

## 2015-07-01 NOTE — Progress Notes (Signed)
PT DAILY TREATMENT NOTE 2-15    Patient Name: Christine York  Date:07/01/2015  DOB: 1975-08-17    Patient DOB Verified  Payor: [employer] / [place]: [place] [employer] [place] HIX / Product Type: HIX /    In time:8:35 AM  Out time:9:45 AM  Total Treatment Time (min): 70  Visit #: 2     Treatment Area: Neck pain [M54.2]  Thoracic back pain [M54.6]    SUBJECTIVE  Pain Level (0-10 scale): 6-8/10 R hip pain.  Any medication changes, allergies to medications, adverse drug reactions, diagnosis change, or new procedure performed?:  No     Yes (see summary sheet for update)  Subjective functional status/changes:    No changes reported  Patient reports greatest pain is in her R hip, states "comes and goes" with HA's, increased in presence of sunlight, loud noises.  States intermittent compliance with HEP due to increased R hip pain.     OBJECTIVE    Modality rationale: decrease edema, decrease inflammation and decrease pain to improve the patient???s ability to sleep, walk, navigate stairs, exercise without pain/limitation   Min Type Additional Details   15  Estim: Att   Unatt        TENS instruct                  IFC  Premod   NMES                     Other:  w/US   w/ice   w/heat  Position:supine with LE's elevated  Location: R piriformis region      Traction:  Cervical       Lumbar                        Prone          Supine                       Intermittent   Continuous Lbs:   before manual   after manual  w/heat      Ultrasound: Continuous    Pulsed at:                            1MHz   3MHz Location:  W/cm2:      Paraffin         Location:  w/heat      Ice       Heat    Ice massage Position:  Location:      Laser    Other: Position:  Location:      Vasopneumatic Device Pressure:        lo  med  hi   Temperature:     Skin assessment post-treatment:  intact redness- no adverse reaction    redness ??? adverse reaction:     40 min Therapeutic Exercise:   See flow sheet : reviewed HEP, increased  pain with SKTC so discontinued after 3 reps, added TrA brace in HL with biofeedback cuff, with iso hip add, R heelslides with strap for gentle hip ROM, Gentle R knee fall out to improve hip AROM   Rationale: increase ROM, increase strength and improve coordination to improve the patient???s ability to sleep, walk, navigate stairs, exercise without pain/limitation    15 min Manual Therapy:  Gentle STM/MFR R IT band, piriformis, glut med and max muscles   Rationale:  decrease pain, increase ROM and increase tissue extensibility  to improve the patient???s ability to sleep, walk, navigate stairs, exercise without pain/limitation            With    TE    TA    neuro    other: Patient Education:  Review HEP     Progressed/Changed HEP based on:    positioning    body mechanics    transfers    heat/ice application     other: advised to break up her trip to [place] this weekend to avoid prolonged sitting, use of pillow for lumbar support, to ice 2-3x/day to reduce pain and inflammation.      Other Objective/Functional Measures: nt     Pain Level (0-10 scale) post treatment: "numb"    ASSESSMENT/Changes in Function:   Patient with difficulty engaging TrA muscle, verbal and tactile cues for technique. Improved with use of biofeedback cuff. Patient reporting increase R hip pain with SKTC so discontinued. Decreased tolerance to exercises and patient reporting pain so minimal exercises performed today. Trial with manual therapy.     Patient will continue to benefit from skilled PT services to modify and progress therapeutic interventions, address functional mobility deficits, address ROM deficits, address strength deficits, analyze and cue movement patterns and instruct in home and community integration to attain remaining goals.       See Plan of Care    See progress note/recertification    See Discharge Summary         Progress towards goals / Updated goals:  nt    PLAN    Upgrade activities as tolerated       Continue plan of care     Update interventions per flow sheet         Discharge due to:_    Other:_      Lianne CureBrittaney N Rashawd Laskaris, PTA 07/01/2015  8:35 AM

## 2015-07-08 ENCOUNTER — Inpatient Hospital Stay
Admit: 2015-07-08 | Payer: PRIVATE HEALTH INSURANCE | Attending: Rehabilitative and Restorative Service Providers" | Primary: Family

## 2015-07-08 ENCOUNTER — Ambulatory Visit: Admit: 2015-07-08 | Discharge: 2015-07-08 | Attending: Family Medicine | Primary: Family

## 2015-07-08 ENCOUNTER — Encounter: Admit: 2015-07-08 | Primary: Family

## 2015-07-08 DIAGNOSIS — M542 Cervicalgia: Secondary | ICD-10-CM

## 2015-07-08 DIAGNOSIS — M7061 Trochanteric bursitis, right hip: Secondary | ICD-10-CM

## 2015-07-08 DIAGNOSIS — S060X0D Concussion without loss of consciousness, subsequent encounter: Secondary | ICD-10-CM

## 2015-07-08 MED ORDER — FLUOCINONIDE 0.05 % OINTMENT
0.05 % | CUTANEOUS | 2 refills | Status: DC
Start: 2015-07-08 — End: 2016-02-13

## 2015-07-08 MED ORDER — MELOXICAM 15 MG TAB
15 mg | ORAL_TABLET | Freq: Every day | ORAL | 3 refills | Status: AC
Start: 2015-07-08 — End: ?

## 2015-07-08 NOTE — Addendum Note (Signed)
Addended by: Cassell ClementBALA, Jackalynn Art K on: 07/08/2015 04:00 PM      Modules accepted: Orders

## 2015-07-08 NOTE — Progress Notes (Signed)
PT DAILY TREATMENT NOTE 2-15    Patient Name: Christine York  Date:07/08/2015  DOB: 1975/09/02    Patient DOB Verified  Payor: COVENTRY / Plan: [place] [employer] [facility] HIX / Product Type: HIX /    In time: 7:30 AM  Out time: 8:20 AM  Total Treatment Time (min): 50 (40 timed)  Visit #: 3    Treatment Area: Neck pain [M54.2]  Thoracic back pain [M54.6]    SUBJECTIVE  Pain Level (0-10 scale): 8/10 R hip pain.  Any medication changes, allergies to medications, adverse drug reactions, diagnosis change, or new procedure performed?:  No     Yes (see summary sheet for update)  Subjective functional status/changes:    No changes reported  Patient states that her R hip is a constant, high pain level. The low back gets loosened up throughout the day, however her hip is the most painful at this time. She states no improvement since beginning PT and no change in hip pain after manual techniques and estim last visit. "Doing anything on this side [right] hurts this hip." She has appointment with Dr. Warren LacyBala this afternoon.    OBJECTIVE    Modality rationale: decrease edema, decrease inflammation and decrease pain to improve the patient???s ability to sleep, walk, navigate stairs, exercise without pain/limitation   Min Type Additional Details   Pt declined  Estim: Att   Unatt        TENS instruct                  IFC  Premod   NMES                     Other:  w/US   w/ice   w/heat  Position:supine with LE's elevated  Location: R piriformis region      Traction:  Cervical       Lumbar                        Prone          Supine                       Intermittent   Continuous Lbs:   before manual   after manual  w/heat      Ultrasound: Continuous    Pulsed at:                            1MHz   3MHz Location:  W/cm2:      Paraffin         Location:  w/heat   10   Ice       Heat    Ice massage Position: supine  Location: R hip      Laser    Other: Position:  Location:      Vasopneumatic Device Pressure:        lo  med  hi   Temperature:      Skin assessment post-treatment:  intact redness- no adverse reaction    redness ??? adverse reaction:     40 min Therapeutic Exercise:   See flow sheet : Reviewed HEP. Pain with LTR, SKTC, R hip SKFO so discontinued exercises. Performed SKFO on L LE for core strengthening. Pt reported pain during weight bearing, so unable to perform standing exercises   Rationale: increase ROM, increase strength and improve coordination to improve the  patient???s ability to sleep, walk, navigate stairs, exercise without pain/limitation    Pt declined min Manual Therapy:  Gentle STM/MFR R IT band, piriformis, glut med and max muscles   Rationale: decrease pain, increase ROM and increase tissue extensibility  to improve the patient???s ability to sleep, walk, navigate stairs, exercise without pain/limitation            With    TE    TA    neuro    other: Patient Education:  Review HEP     Progressed/Changed HEP based on:    positioning    body mechanics    transfers    heat/ice application     other:     Other Objective/Functional Measures:   Painful PROM R hip flexion, hip IR, hip ER  TTP R TFL, GT, ITB     Pain Level (0-10 scale) post treatment: 6/10    ASSESSMENT/Changes in Function:   Patient with decreased tolerance to supine and standing therapeutic exercises due reports of high R hip pain. Pt declined manual techniques and estim due to no change in symptoms after last session. R hip pain with all PROM, AROM, and generalized R hip TTP. Utilized biofeedback cuff for proper TrA contraction. Slow, guarded movement throughout session today.    Patient will continue to benefit from skilled PT services to modify and progress therapeutic interventions, address functional mobility deficits, address ROM deficits, address strength deficits, analyze and cue movement patterns and instruct in home and community integration to attain remaining goals.       See Plan of Care    See progress note/recertification    See Discharge Summary          Progress towards goals / Updated goals:  nt    PLAN    Upgrade activities as tolerated       Continue plan of care    Update interventions per flow sheet         Discharge due to:_    Other:_      Darnelle BosKatharine L Melaya Hoselton, PT 07/08/2015  7:41 AM
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
## 2015-07-08 NOTE — Addendum Note (Signed)
Addended by: Cassell ClementBALA, Curtiss Mahmood K on: 07/08/2015 04:29 PM      Modules accepted: Orders

## 2015-07-12 ENCOUNTER — Ambulatory Visit: Admit: 2015-07-12 | Discharge: 2015-07-13 | Attending: Family Medicine | Primary: Family

## 2015-07-12 ENCOUNTER — Encounter: Attending: Family Medicine | Primary: Family

## 2015-07-12 DIAGNOSIS — F0781 Postconcussional syndrome: Secondary | ICD-10-CM

## 2015-07-12 MED ORDER — TRETINOIN 0.05 % TOPICAL CREAM
0.05 % | CUTANEOUS | 5 refills | Status: AC
Start: 2015-07-12 — End: ?

## 2015-07-12 MED ORDER — NORTRIPTYLINE 10 MG CAP
10 mg | ORAL_CAPSULE | Freq: Every evening | ORAL | 2 refills | Status: AC
Start: 2015-07-12 — End: ?
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
## 2015-07-20 ENCOUNTER — Inpatient Hospital Stay
Admit: 2015-07-20 | Payer: PRIVATE HEALTH INSURANCE | Attending: Rehabilitative and Restorative Service Providers" | Primary: Family

## 2015-07-20 NOTE — Progress Notes (Signed)
PT DAILY TREATMENT NOTE 2-15    Patient Name: Christine York  Date:07/20/2015  DOB: [birth year]    Patient DOB Verified  Payor: COVENTRY / Plan: [place] [employer] [facility] HIX / Product Type: HIX /    In time: 8:00 AM  Out time: 9:00 AM  Total Treatment Time (min): 60 (50 timed)  Visit #: 4    Treatment Area: Neck pain [M54.2]  Thoracic back pain [M54.6]    SUBJECTIVE  Pain Level (0-10 scale): 7/10 R hip pain.  Any medication changes, allergies to medications, adverse drug reactions, diagnosis change, or new procedure performed?:  No     Yes (see summary sheet for update)  Subjective functional status/changes:    No changes reported  Patient states she continues to have R hip pain but has not noticed significant change since coming to physical therapy. States she saw Christine Engels NP, and was given rx strength NSAID and has been taking this.     OBJECTIVE    Modality rationale: decrease edema, decrease inflammation and decrease pain to improve the patient???s ability to sleep, walk, navigate stairs, exercise without pain/limitation   Min Type Additional Details   10  Estim: Att   Unatt        TENS instruct                  IFC  Premod   NMES                     Other:  w/US   w/ice   w/heat  Position:L s/l  Location: R GT      Traction:  Cervical       Lumbar                        Prone          Supine                       Intermittent   Continuous Lbs:   before manual   after manual  w/heat      Ultrasound: Continuous    Pulsed at:                              Location:  W/cm2:      Paraffin         Location:  w/heat      Ice       Heat    Ice massage Position: supine  Location: R hip      Laser    Other: Position:  Location:      Vasopneumatic Device Pressure:        lo  med  hi   Temperature:     Skin assessment post-treatment:  intact redness- no adverse reaction    redness ??? adverse reaction:     40 min Therapeutic Exercise:   See flow sheet : hip 2 way, clamshell, supine hip 2 way    Rationale: increase ROM, increase strength and improve coordination to improve the patient???s ability to sleep, walk, navigate stairs, exercise without pain/limitation    10 min Manual Therapy:  Gentle STM/MFR R IT band, piriformis, glut med and max muscles   Rationale: decrease pain, increase ROM and increase tissue extensibility  to improve the patient???s ability to sleep, walk, navigate stairs, exercise without pain/limitation  With    TE    TA    neuro    other: Patient Education:  Review HEP     Progressed/Changed HEP based on:    positioning    body mechanics    transfers    heat/ice application     other:     Other Objective/Functional Measures: Mod TTP R IT band piriformis, glut med, max  Muscles, GT       Pain Level (0-10 scale) post treatment: 0/10    ASSESSMENT/Changes in Function:   Patient able to perform hip abd/ER (clamshell) against gravity and able to tolerate increased pressure with manual therapy. Tolerated new ROM and glut strengthening exercises without increased pain. Trial with estim along R GT today caused decrease in pain.     Patient will continue to benefit from skilled PT services to modify and progress therapeutic interventions, address functional mobility deficits, address ROM deficits, address strength deficits, analyze and cue movement patterns and instruct in home and community integration to attain remaining goals.       See Plan of Care    See progress note/recertification    See Discharge Summary         Progress towards goals / Updated goals:  nt    PLAN    Upgrade activities as tolerated       Continue plan of care    Update interventions per flow sheet         Discharge due to:_    Other:_      Lianne CureBrittaney N Gwendlyon Zumbro, PTA 07/20/2015  8:00 AM

## 2015-07-22 ENCOUNTER — Inpatient Hospital Stay
Admit: 2015-07-22 | Payer: PRIVATE HEALTH INSURANCE | Attending: Rehabilitative and Restorative Service Providers" | Primary: Family

## 2015-07-22 NOTE — Progress Notes (Addendum)
PT DAILY TREATMENT NOTE 2-15    Patient Name: Christine York  Date:07/22/2015  DOB: 1975/05/14    Patient DOB Verified  Payor: [employer] / Plan: [place] [employer] / Product Type: HIX /    In time: 7:30 AM  Out time: 8:35 AM  Total Treatment Time (min): 65 (55 timed)  Visit #: 5    Treatment Area: Neck pain [M54.2]  Thoracic back pain [M54.6]    SUBJECTIVE  Pain Level (0-10 scale): 8/10 R hip and low back  Any medication changes, allergies to medications, adverse drug reactions, diagnosis change, or new procedure performed?:  No     Yes (see summary sheet for update)  Subjective functional status/changes:    No changes reported  "I feel worse after coming here. Maybe its just because I'm working things out" She states she did not feel a difference after the e-stim and prefers just ice. She reports that she ices her hip 3-4x/day.  "I need to schedule an appointment for concussion therapy for me as well as my daughter"    OBJECTIVE    Modality rationale: decrease edema, decrease inflammation and decrease pain to improve the patient???s ability to sleep, walk, navigate stairs, exercise without pain/limitation   Min Type Additional Details   Pt declined  Estim: Att   Unatt        TENS instruct                  IFC  Premod   NMES                     Other:  w/US   w/ice   w/heat  Position:L s/l  Location: R GT      Traction:  Cervical       Lumbar                        Prone          Supine                       Intermittent   Continuous Lbs:   before manual   after manual  w/heat      Ultrasound: Continuous    Pulsed at:                            1MHz   3MHz Location:  W/cm2:      Paraffin         Location:  w/heat   10   Ice       Heat    Ice massage Position: supine  Location: R hip      Laser    Other: Position:  Location:      Vasopneumatic Device Pressure:        lo  med  hi   Temperature:     Skin assessment post-treatment:  intact redness- no adverse reaction    redness ??? adverse reaction:      45 min Therapeutic Exercise:   See flow sheet : heel slides with strap   Rationale: increase ROM, increase strength and improve coordination to improve the patient???s ability to sleep, walk, navigate stairs, exercise without pain/limitation    10 min Manual Therapy:  Gentle STM/MFR R IT band, piriformis, glut med and max muscles   Rationale: decrease pain, increase ROM and increase tissue extensibility  to improve  the patient???s ability to sleep, walk, navigate stairs, exercise without pain/limitation            With    TE    TA    neuro    other: Patient Education:  Review HEP     Progressed/Changed HEP based on:    positioning    body mechanics    transfers    heat/ice application     other:     Other Objective/Functional Measures: Mod TTP R IT band piriformis, glut med, max  Muscles, GT     Pain Level (0-10 scale) post treatment: 0/10 R hip; "just my low back"    ASSESSMENT/Changes in Function:   Pt with reports of increased R hip "soreness" post treatment session. Limited with progression of exercises due to R hip pain with all R LE AROM and R LE weight bearing.    Patient will continue to benefit from skilled PT services to modify and progress therapeutic interventions, address functional mobility deficits, address ROM deficits, address strength deficits, analyze and cue movement patterns and instruct in home and community integration to attain remaining goals.       See Plan of Care    See progress note/recertification    See Discharge Summary         Progress towards goals / Updated goals:  nt    PLAN    Upgrade activities as tolerated       Continue plan of care    Update interventions per flow sheet         Discharge due to:_    Other:_      Darnelle Bos, PT 07/22/2015  7:32 AM

## 2015-07-27 ENCOUNTER — Inpatient Hospital Stay
Admit: 2015-07-27 | Payer: PRIVATE HEALTH INSURANCE | Attending: Rehabilitative and Restorative Service Providers" | Primary: Family

## 2015-07-27 NOTE — Progress Notes (Signed)
PT DAILY TREATMENT/Progress NOTE 2-15    Patient Name: Christine York  Date:07/27/2015  DOB: [birth year]    Patient DOB Verified  Payor: [employer] / Plan: [place] [employer] / Product Type: HIX /    In time: 7:30 AM  Out time: 8:35 AM  Total Treatment Time (min): 65 (55 timed)  Visit #: 6    Treatment Area: Neck pain [M54.2]  Thoracic back pain [M54.6]    SUBJECTIVE  Pain Level (0-10 scale): 8/10 R hip and low back  Any medication changes, allergies to medications, adverse drug reactions, diagnosis change, or new procedure performed?:  No     Yes (see summary sheet for update)  Subjective functional status/changes:    No changes reported  "I don't see how this is helping me at all". She states over the weekend she "twisted on her hip weird" and now it hurts even more. She is going to call Dr. Paulene Floor office today and schedule a f/u appointment- "but I really don't want the shot". She made appointment with Varney Baas, PT, DPT, ATC, MTC for concussion therapy.  She states 0% improvement since beginning therapy.    OBJECTIVE    Modality rationale: decrease edema, decrease inflammation and decrease pain to improve the patient???s ability to sleep, walk, navigate stairs, exercise without pain/limitation   Min Type Additional Details   Pt declined  Estim: Att   Unatt        TENS instruct                  IFC  Premod   NMES                     Other:  w/US   w/ice   w/heat  Position:L s/l  Location: R GT      Traction:  Cervical       Lumbar                        Prone          Supine                       Intermittent   Continuous Lbs:   before manual   after manual  w/heat   8   Ultrasound: Continuous    Pulsed at:                            1MHz   3MHz Location: R GT  W/cm2: 1.0      Paraffin         Location:  w/heat   10   Ice       Heat    Ice massage Position: supine  Location: R hip      Laser    Other: Position:  Location:      Vasopneumatic Device Pressure:        lo  med  hi   Temperature:      Skin assessment post-treatment:  intact redness- no adverse reaction    redness ??? adverse reaction:     47 min Therapeutic Exercise:   See flow sheet : Reassessment performed   Rationale: increase ROM, increase strength and improve coordination to improve the patient???s ability to sleep, walk, navigate stairs, exercise without pain/limitation    -- min Manual Therapy:  Gentle STM/MFR R IT band, piriformis, glut med and  max muscles   Rationale: decrease pain, increase ROM and increase tissue extensibility  to improve the patient???s ability to sleep, walk, navigate stairs, exercise without pain/limitation            With    TE    TA    neuro    other: Patient Education:  Review HEP     Progressed/Changed HEP based on:    positioning    body mechanics    transfers    heat/ice application     other:     Other Objective/Functional Measures: Mod TTP R IT band piriformis, glut med, max  Muscles, GT  Mod TTP R hip flexors  Severe TTP R GT    50% SLR on R. Painful    PROM: pain with R hip flexion, hip IR, hip ER    Pain with weight bearing through R LE     Pain Level (0-10 scale) post treatment: 0/10    ASSESSMENT/Changes in Function:   Pt has completed 6 skilled PT visits. She has met 1/3 STG's and 0/4 LTG's at this time. She reports 0% improvement since beginning therapy despite trials with therapeutic exercises, manual techniques, electrical stimulation, and ultrasound. Pt states her pain is constantly 8/10 without relief-- main complaint at this time is R hip. Therapy progression limited due to pt unable to tolerate standing exercises or R hip AROM. Advised pt to schedule f/u appointment with Dr. Willis Modena for next steps in [place].    Patient will continue to benefit from skilled PT services to modify and progress therapeutic interventions, address functional mobility deficits, address ROM deficits, address strength deficits, analyze and cue movement patterns and instruct in home and community integration to attain  remaining goals.       See Plan of Care    See progress note/recertification    See Discharge Summary         Progress towards goals / Updated goals:  Short Term Goals: To be accomplished in 2-3 weeks:  1) Pt will be independent in initial HEP MET  2) Pt will report > or =25% decrease in exacerbation of symptoms not met  3) Pt will report being able to sleep throughout the night without being awoken from symptoms. Not met  ??  Long Term Goals: To be accomplished in 4-6 weeks:  1) Pt will increase bilateral shoulder flex/abd AROM to at least 170 deg in order to reach into cabinets not assessed  2) Pt will report being able to walk on treadmill for at least 30 min in order to return to exercise not met  3) Pt will increase bilat hip flexion strength to at least 4/5 in order to exercise, perform household chores not met  4) Pt will increase FOTO score to at least 62/100 in order to demonstrate increase in functional mobility not met    PLAN    Upgrade activities as tolerated       Continue plan of care    Update interventions per flow sheet         Discharge due to:_    Other:_      Timoteo Gaul, PT 07/27/2015  7:57 AM

## 2015-07-29 ENCOUNTER — Inpatient Hospital Stay
Admit: 2015-07-29 | Payer: PRIVATE HEALTH INSURANCE | Attending: Rehabilitative and Restorative Service Providers" | Primary: Family

## 2015-07-29 NOTE — Progress Notes (Signed)
PT DAILY TREATMENT NOTE 2-15    Patient Name: Christine York  Date:07/29/2015  DOB: [birth year]    Patient DOB Verified  Payor: [employer] / Plan: [place] [employer] / Product Type: HIX /    In time: 7:30 AM  Out time: 830 AM  Total Treatment Time (min): 60 (50 timed)  Visit #: 7    Treatment Area: Neck pain [M54.2]  Thoracic back pain [M54.6]    SUBJECTIVE  Pain Level (0-10 scale): 8/10 R hip and low back  Any medication changes, allergies to medications, adverse drug reactions, diagnosis change, or new procedure performed?:  No     Yes (see summary sheet for update)  Subjective functional status/changes:    No changes reported  "I went walking for 20 min after I was here on Tuesday and tried to go for 30 minutes yesterday. I was really sore so I did the stretches and I iced."   She has appointment with Dr. Willis Modena next Friday.  "I think the ultrasound helped some last time"    OBJECTIVE    Modality rationale: decrease edema, decrease inflammation and decrease pain to improve the patient???s ability to sleep, walk, navigate stairs, exercise without pain/limitation   Min Type Additional Details   Pt declined  Estim: Att   Unatt        TENS instruct                  IFC  Premod   NMES                     Other:  w/US   w/ice   w/heat  Position:L s/l  Location: R GT      Traction:  Cervical       Lumbar                        Prone          Supine                       Intermittent   Continuous Lbs:   before manual   after manual  w/heat   8   Ultrasound: Continuous    Pulsed at:                            1MHz   3MHz Location: R GT  W/cm2: 1.0      Paraffin         Location:  w/heat   10   Ice       Heat    Ice massage Position: supine  Location: R hip      Laser    Other: Position:  Location:      Vasopneumatic Device Pressure:        lo  med  hi   Temperature:     Skin assessment post-treatment:  intact redness- no adverse reaction    redness ??? adverse reaction:      32 min Therapeutic Exercise:   See flow sheet : Added recumbent elliptical   Rationale: increase ROM, increase strength and improve coordination to improve the patient???s ability to sleep, walk, navigate stairs, exercise without pain/limitation    10 min Manual Therapy:  Gentle STM/MFR R IT band, piriformis, glut med and max muscles  Gentle IR, ER R hip PROM in supine   Rationale: decrease pain, increase  ROM and increase tissue extensibility  to improve the patient???s ability to sleep, walk, navigate stairs, exercise without pain/limitation            With    TE    TA    neuro    other: Patient Education:  Review HEP     Progressed/Changed HEP based on:    positioning    body mechanics    transfers    heat/ice application     other:     Other Objective/Functional Measures:   --     Pain Level (0-10 scale) post treatment: 0/10    ASSESSMENT/Changes in Function:   Pt tolerated addition of recumbent elliptical today. Continues to report consistent, high pain levels.    Patient will continue to benefit from skilled PT services to modify and progress therapeutic interventions, address functional mobility deficits, address ROM deficits, address strength deficits, analyze and cue movement patterns and instruct in home and community integration to attain remaining goals.       See Plan of Care    See progress note/recertification    See Discharge Summary         Progress towards goals / Updated goals:  Short Term Goals: To be accomplished in 2-3 weeks:  1) Pt will be independent in initial HEP MET  2) Pt will report > or =25% decrease in exacerbation of symptoms not met  3) Pt will report being able to sleep throughout the night without being awoken from symptoms. Not met  ??  Long Term Goals: To be accomplished in 4-6 weeks:  1) Pt will increase bilateral shoulder flex/abd AROM to at least 170 deg in order to reach into cabinets not assessed  2) Pt will report being able to walk on treadmill for at least 30 min in  order to return to exercise not met  3) Pt will increase bilat hip flexion strength to at least 4/5 in order to exercise, perform household chores not met  4) Pt will increase FOTO score to at least 62/100 in order to demonstrate increase in functional mobility not met    PLAN    Upgrade activities as tolerated       Continue plan of care    Update interventions per flow sheet         Discharge due to:_    Other:_      Timoteo Gaul, PT 07/29/2015  7:38 AM

## 2015-08-03 ENCOUNTER — Inpatient Hospital Stay
Admit: 2015-08-03 | Payer: PRIVATE HEALTH INSURANCE | Attending: Rehabilitative and Restorative Service Providers" | Primary: Family

## 2015-08-03 DIAGNOSIS — S060X9A Concussion with loss of consciousness of unspecified duration, initial encounter: Secondary | ICD-10-CM

## 2015-08-03 NOTE — Progress Notes (Signed)
PT DAILY TREATMENT NOTE 2-15    Patient Name: Schwanda Zima  Date:08/03/2015  DOB: 10-18-1975    Patient DOB Verified  Payor: [employer] / Plan: [place] [employer] / Product Type: HIX /    In time: 7:30 AM  Out time: 840 AM  Total Treatment Time (min): 70 (60 timed)  Visit #: 8    Treatment Area: Neck pain [M54.2]  Thoracic back pain [M54.6]    SUBJECTIVE  Pain Level (0-10 scale): 7/10 R hip and low back  Any medication changes, allergies to medications, adverse drug reactions, diagnosis change, or new procedure performed?:  No     Yes (see summary sheet for update)  Subjective functional status/changes:    No changes reported  Pt states she went walking on Sunday for 20 min and now she has a new pain [pt points near R TFL]- "I twisted something. I wasn't even walking that fast. The rest of my hip feels the exact same, no change. The ultrasound did good though"  She sees Dr. Bala at 9:30 AM on Friday    OBJECTIVE    Modality rationale: decrease edema, decrease inflammation and decrease pain to improve the patient???s ability to sleep, walk, navigate stairs, exercise without pain/limitation   Min Type Additional Details   Pt declined  Estim: Att   Unatt        TENS instruct                  IFC  Premod   NMES                     Other:  w/US   w/ice   w/heat  Position:L s/l  Location: R GT      Traction:  Cervical       Lumbar                        Prone          Supine                       Intermittent   Continuous Lbs:   before manual   after manual  w/heat   8   Ultrasound: Continuous    Pulsed at:                            1MHz   3MHz Location: R GT  W/cm2: 1.0      Paraffin         Location:  w/heat   10   Ice       Heat    Ice massage Position: supine  Location: R hip      Laser    Other: Position:  Location:      Vasopneumatic Device Pressure:        lo  med  hi   Temperature:     Skin assessment post-treatment:  intact redness- no adverse reaction    redness ??? adverse reaction:      37  min Therapeutic Exercise:   See flow sheet : recumbent elliptical   Rationale: increase ROM, increase strength and improve coordination to improve the patient???s ability to sleep, walk, navigate stairs, exercise without pain/limitation    15 min Manual Therapy:  Gentle STM/MFR R IT band, piriformis, glut med and max muscles, R TFL  Gentle use of roller over R ITB  Manual gentle R ITB stretch   Rationale: decrease pain, increase ROM and increase tissue extensibility  to improve the patient???s ability to sleep, walk, navigate stairs, exercise without pain/limitation            With    TE    TA    neuro    other: Patient Education:  Review HEP     Progressed/Changed HEP based on:    positioning    body mechanics    transfers    heat/ice application     other:     Other Objective/Functional Measures:   TTP R TFL     Pain Level (0-10 scale) post treatment: 7/10    ASSESSMENT/Changes in Function:   No change in pain level throughout treatment session, despite ultrasound, manual techniques, and therapeutic exercise. Encouraged pt to continue stretching, icing at home.    Patient will continue to benefit from skilled PT services to modify and progress therapeutic interventions, address functional mobility deficits, address ROM deficits, address strength deficits, analyze and cue movement patterns and instruct in home and community integration to attain remaining goals.       See Plan of Care    See progress note/recertification    See Discharge Summary         Progress towards goals / Updated goals:  Short Term Goals: To be accomplished in 2-3 weeks:  1) Pt will be independent in initial HEP MET  2) Pt will report > or =25% decrease in exacerbation of symptoms not met  3) Pt will report being able to sleep throughout the night without being awoken from symptoms. Not met  ??  Long Term Goals: To be accomplished in 4-6 weeks:  1) Pt will increase bilateral shoulder flex/abd AROM to at least 170 deg  in order to reach into cabinets not assessed  2) Pt will report being able to walk on treadmill for at least 30 min in order to return to exercise not met  3) Pt will increase bilat hip flexion strength to at least 4/5 in order to exercise, perform household chores not met  4) Pt will increase FOTO score to at least 62/100 in order to demonstrate increase in functional mobility not met    PLAN    Upgrade activities as tolerated       Continue plan of care    Update interventions per flow sheet         Discharge due to:_    Other:_      Timoteo Gaul, PT 08/03/2015  7:33 AM

## 2015-08-05 ENCOUNTER — Inpatient Hospital Stay
Admit: 2015-08-05 | Payer: PRIVATE HEALTH INSURANCE | Attending: Rehabilitative and Restorative Service Providers" | Primary: Family

## 2015-08-05 NOTE — Progress Notes (Signed)
PT DAILY TREATMENT/Progress NOTE 2-15    Patient Name: Christine York  Date:08/05/2015  DOB: [birth year]    Patient DOB Verified  Payor: COVENTRY / Plan: [place] [employer] [facility] HIX / Product Type: HIX /    In time: 7:30 AM  Out time: 840 AM  Total Treatment Time (min): 70 (60 timed)  Visit #: 9    Treatment Area: Neck pain [M54.2]  Thoracic back pain [M54.6]    SUBJECTIVE  Pain Level (0-10 scale): 7/10 R hip and low back  Any medication changes, allergies to medications, adverse drug reactions, diagnosis change, or new procedure performed?:  No     Yes (see summary sheet for update)  Subjective functional status/changes:    No changes reported  Pt reports that she feels zero % improvement with therapy- "But I think the ultrasound is helping". She states her neck pain has gotten much better and that her headaches are gradually lessening- she has appointment for concussion therapy next week. She states that her R hip is "still the same, no change. It hurts all the time". She reported today and she has noticed that sometimes when she sits for a while that her toes will go numb.  F/u appointment with Dr. Willis Modena tomorrow at 9:30 AM    OBJECTIVE    Modality rationale: decrease edema, decrease inflammation and decrease pain to improve the patient???s ability to sleep, walk, navigate stairs, exercise without pain/limitation   Min Type Additional Details     Estim: Att   Unatt        TENS instruct                  IFC  Premod   NMES                     Other:  w/US   w/ice   w/heat  Position:L s/l  Location: R GT      Traction:  Cervical       Lumbar                        Prone          Supine                       Intermittent   Continuous Lbs:   before manual   after manual  w/heat   8   Ultrasound: Continuous    Pulsed at:                            1MHz   3MHz Location: R GT  W/cm2: 1.0      Paraffin         Location:  w/heat   10   Ice       Heat    Ice massage Position: supine  Location: R hip      Laser    Other: Position:   Location:      Vasopneumatic Device Pressure:        lo  med  hi   Temperature:     Skin assessment post-treatment:  intact redness- no adverse reaction    redness ??? adverse reaction:     42 min Therapeutic Exercise:   See flow sheet : Reassessment performed   Rationale: increase ROM, increase strength and improve coordination to improve the patient???s ability to sleep, walk, navigate stairs, exercise  without pain/limitation    10 min Manual Therapy:  Gentle STM/MFR R IT band, piriformis, glut med and max muscles, R TFL  Gentle use of roller over R ITB   Rationale: decrease pain, increase ROM and increase tissue extensibility  to improve the patient???s ability to sleep, walk, navigate stairs, exercise without pain/limitation            With    TE    TA    neuro    other: Patient Education:  Review HEP     Progressed/Changed HEP based on:    positioning    body mechanics    transfers    heat/ice application     other:     Other Objective/Functional Measures:   ??  Pt ambulates with decreased cadence, however no other major gait abnormalities    UE AROM:   Full shoulder AROM flex, abd. No pain.  ??  Lumbar Active Movements:  ROM  AROM Comments:pain, area   Forward flexion  Fingers to ankles Normal    Extension  < 50-75% Inc'd pain   Seated Rotation right NT -   Seated Rotation left NT -   SB right Superior to patella Inc'd pain   SB left Superior to patella No pain   ??  ??  Strength:     ?? L(0-5) R (0-5) Comments   Hip Flexion (L1,2) 4- 3+ ??   Knee Extension (L3,4) 4 4 P! In R hip with R sided testing   Knee Flexion (S1,2) 4 4 P! In R hip with R sided testing   Ankle Dorsiflexion (L4) 5 4+    ?? ?? ?? ??   SLR NT < 3 P! (50% of full SLR)   Sidelying hip abduction NT < 3 p!   ??  Pain with PROM R hip IR, ER, flexion  ??  Palpation:  Severe TTP over R greater trochanter, R piriformis and gluteal region, R ITB, R hip flexors    Pt stood in SLS on R for approx 3-4 seconds without hip drop-- pt hesitant to perform  ??   Outcome Measure: Hip FOTO= 54/100       Pain Level (0-10 scale) post treatment: not reported    ASSESSMENT/Changes in Function:   Pt has completed 9 skilled PT visits. She has met 1/3 STG's and 1/4 LTG's. She reports zero percent improvement since beginning therapy. She reports she is walking 20 min everyday. Reviewed with patient improvements that she has made since initial visit-- including improved UE ROM, improved ability to ambulate/weight bear through R LE, decreased TTP over R hip soft tissue, and improved lumbar ROM. Pt continuing to report consistent 7-8/10 pain in R hip despite trial with manual techniques, ultrasound, therapeutic exercises, and electrical stimulation. Therapist gave pt handout for massage therapy due to pt request for herself, husband, and daughter. Pt has f/u appointment with Dr. Willis Modena tomorrow morning-- pt advised that she would benefit from continued PT, however pt hesitant about continuing to make a 40 min commute to therapy 2x/wk as well as addition of concussion therapy. Pt advised to call PT office with regard to MD recommendation tomorrow.     Patient will continue to benefit from skilled PT services to modify and progress therapeutic interventions, address functional mobility deficits, address ROM deficits, address strength deficits, analyze and cue movement patterns and instruct in home and community integration to attain remaining goals.       See Plan of Care  See progress note/recertification    See Discharge Summary         Progress towards goals / Updated goals:  Short Term Goals: To be accomplished in 2-3 weeks:  1) Pt will be independent in initial HEP MET  2) Pt will report > or =25% decrease in exacerbation of symptoms not met  3) Pt will report being able to sleep throughout the night without being awoken from symptoms. Not met  ??  Long Term Goals: To be accomplished in 4-6 weeks:  1) Pt will increase bilateral shoulder flex/abd AROM to at least 170 deg  in order to reach into cabinets MET  2) Pt will report being able to walk on treadmill for at least 30 min in order to return to exercise not met  3) Pt will increase bilat hip flexion strength to at least 4/5 in order to exercise, perform household chores not met  4) Pt will increase FOTO score to at least 62/100 in order to demonstrate increase in functional mobility not met    PLAN    Upgrade activities as tolerated       Continue plan of care    Update interventions per flow sheet         Discharge due to:_    Other:_see above    Timoteo Gaul, PT 08/05/2015  7:50 AM

## 2015-08-06 ENCOUNTER — Ambulatory Visit: Admit: 2015-08-06 | Attending: Family Medicine | Primary: Family

## 2015-08-06 ENCOUNTER — Encounter: Admit: 2015-08-06 | Primary: Family

## 2015-08-06 DIAGNOSIS — M5440 Lumbago with sciatica, unspecified side: Secondary | ICD-10-CM

## 2015-08-06 DIAGNOSIS — M7061 Trochanteric bursitis, right hip: Secondary | ICD-10-CM

## 2015-08-06 MED ORDER — TRIAMCINOLONE ACETONIDE 40 MG/ML SUSP FOR INJECTION
40 mg/mL | Freq: Once | INTRAMUSCULAR | 0 refills | Status: AC
Start: 2015-08-06 — End: 2015-08-06
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
## 2015-08-09 ENCOUNTER — Inpatient Hospital Stay
Admit: 2015-08-09 | Payer: PRIVATE HEALTH INSURANCE | Attending: Rehabilitative and Restorative Service Providers" | Primary: Family
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
## 2015-08-09 NOTE — Progress Notes (Signed)
[facility]  [address]  [place], [place] [id number]  Phone: [phone]  Fax: [phone]    Plan of Care/Statement of Necessity for Physical Therapy Services  2-15    Patient name: Christine York  DOB: 1975-08-03  Provider#: [id number]  Referral source: Darin EngelsKuna, Christie, NP      Medical/Treatment Diagnosis: Concussion [S06.0X9A]     Prior Hospitalization: see medical history     Comorbidities: neck pain  Prior Level of Function: complete 20 minutes of exercise at least 3 times a week  Medications: Verified on Patient Summary List    Start of Care: 08/09/2015      Onset Date: 06/11/2015       The Plan of Care and following information is based on the information from the initial evaluation.  Assessment/ key information: 40 y.o. Female with post-concussion symptoms of visual disturbance (smooth pursuit, gaze stabilization, VOR Cancellation) and mild balance deficit complicated by cervicogenic contribution.    Overall Complexity Rating: LOW   ??  Problem List: pain affecting function, decrease ROM, decrease strength, decrease ADL/ functional abilitiies, decrease activity tolerance, decrease flexibility/ joint mobility and decrease transfer abilities                         Treatment Plan may include any combination of the following: Therapeutic exercise, Therapeutic activities, Neuromuscular re-education, Physical agent/modality, Gait/balance training, Manual therapy, Patient education, Self Care training and Functional mobility training  Patient / Family readiness to learn indicated by: asking questions, trying to perform skills and interest  Persons(s) to be included in education: patient (P)  Barriers to Learning/Limitations: None  Patient Goal (s): see evaluation  Patient Self Reported Health Status: excellent  Rehabilitation Potential: good  ??  Long Term Goals: To be accomplished in 2-4 weeks:  1) Perform Gaze Stabilization x1 while walking  without symptoms   2) Perform Gaze Stabilization x2 while walking  without symptoms  3) Perform VOR Cancellation while walking without reproduction of symptoms  4) Balance on firm surface with eyes closed >/= 60 seconds without symptoms   5) Balance on soft surface with eyes closed >/= 60 seconds without symptoms  6) Tolerate visual conflict while walking without symptoms  7) Tolerate return to physical activity protocol without symptoms  8) Pt will be able to look over shoulder to drive without pain    Frequency / Duration: Patient to be seen 2 times per week for 2-4 weeks.    Patient/ Caregiver education and instruction: activity modification and exercises      Plan of care has been reviewed with PTA    Trinda PascalMichael D Elaria Osias, PT, DPT 08/09/2015 4:32 PM    ________________________________________________________________________    I certify that the above Therapy Services are being furnished while the patient is under my care. I agree with the treatment plan and certify that this therapy is necessary.    Physician's Signature:____________________  Date:____________Time: _________

## 2015-08-11 ENCOUNTER — Inpatient Hospital Stay
Admit: 2015-08-11 | Payer: PRIVATE HEALTH INSURANCE | Attending: Rehabilitative and Restorative Service Providers" | Primary: Family

## 2015-08-11 NOTE — Progress Notes (Signed)
PT DAILY TREATMENT NOTE - MCR 2-15    Patient Name: Tiney RougeDana Bennick  Date:08/11/2015  DOB: May 01, 1975    Patient DOB Verified  Payor: COVENTRY / Plan: [place] [employer] [facility] HIX / Product Type: HIX /    In time:730a  Out time:830a  Total Treatment Time (min): 60  Total Timed Codes (min): 45  1:1 Treatment Time ([facility] only): --   Visit #: 2     Treatment Area: Concussion [S06.0X9A]    SUBJECTIVE  Pain Level (0-10 scale): 3/10  Any medication changes, allergies to medications, adverse drug reactions, diagnosis change, or new procedure performed?:  No     Yes (see summary sheet for update)  Subjective functional status/changes:    No changes reported  Pt states that she has a headache today "pointing to her right temporal area."   She states that she did not have a headache yesterday when she woke up but the "pain came on as the day went." She reports that she had some dizziness with the smooth pursuit exercise.  She reports computer screen time limited to 15 minutes at which time she reports onset of "headache and I need to just look away."  No dizziness with driving her car.    OBJECTIVE    Modality rationale: decrease pain to improve the patient???s ability to look over shoulders   Min Type Additional Details   15  Estim: Att   Unatt        TENS instruct                  IFC  Premod   NMES                     Other:  w/US   w/ice   w/heat  Position: supine  Location: neck      Traction:  Cervical       Lumbar                        Prone          Supine                       Intermittent   Continuous Lbs:   before manual   after manual  w/heat      Ultrasound: Continuous    Pulsed at:                           1MHz   3MHz Location:  W/cm2:     Paraffin         Location:   w/heat      Ice       Heat    Ice massage Position:  Location:      Laser    Other: Position:  Location:        Vasopneumatic Device Pressure:        lo  med  hi   Temperature:       Skin assessment post-treatment:  intact redness- no adverse reaction     redness ??? adverse reaction:        30 min Neuromuscular Re-education:    See flow sheet : review of current status, progression of visual tracking exercises.   Rationale: increase ROM, improve coordination, improve balance and increase proprioception  to improve the patient???s ability to restore visual performance    15 min Manual Therapy:  STM bilateral cervical paraspinal muscles, general upslide/downslide R and L, passive cervical rotation    Rationale: decrease pain, increase ROM and increase tissue extensibility to improve the patient???s ability to look over shoulders            With    TE    TA    neuro    other: Patient Education:  Review HEP     Progressed/Changed HEP based on:    positioning    body mechanics    transfers    heat/ice application     other:      Other Objective/Functional Measures: gaze stabilization at 100 bpm without symptom reproduction today     Pain Level (0-10 scale) post treatment: 0/10    ASSESSMENT/Changes in Function:   No reproduction of dizziness with gaze stabilization, smooth pursuit and VOR Cancellation training today sitting, standing or walking.  Did improve cervical rotation by approx 10 -15 degrees with manual treatment today.    Patient will continue to benefit from skilled PT services to modify and progress therapeutic interventions, address functional mobility deficits, address ROM deficits, address strength deficits, analyze and address soft tissue restrictions, analyze and cue movement patterns, analyze and modify body mechanics/ergonomics, assess and modify postural abnormalities and address imbalance/dizziness to attain remaining goals.       See Plan of Care    See progress note/recertification    See Discharge Summary         Progress towards goals / Updated goals:  Long Term Goals: To be accomplished in 2-4 weeks:  1) Perform Gaze Stabilization x1 while walking  without symptoms  2) Perform Gaze Stabilization x2 while walking  without symptoms   3) Perform VOR Cancellation while walking without reproduction of symptoms  4) Balance on firm surface with eyes closed >/= 60 seconds without symptoms                 5) Balance on soft surface with eyes closed >/= 60 seconds without symptoms  6) Tolerate visual conflict while walking without symptoms  7) Tolerate return to physical activity protocol without symptoms  8) Pt will be able to look over shoulder to drive without pain  ??    PLAN    Upgrade activities as tolerated       Continue plan of care    Update interventions per flow sheet         Discharge due to:_    Other:_      Trinda Pascal, PT, DPT 08/11/2015  7:39 AM

## 2015-08-13 ENCOUNTER — Encounter: Attending: Rehabilitative and Restorative Service Providers" | Primary: Family

## 2015-08-16 ENCOUNTER — Inpatient Hospital Stay: Admit: 2015-08-16 | Payer: PRIVATE HEALTH INSURANCE | Primary: Family

## 2015-08-16 NOTE — Progress Notes (Signed)
PT DAILY TREATMENT NOTE - MCR 2-15    Patient Name: Christine York  Date:08/16/2015  DOB: 1975/05/17    Patient DOB Verified  Payor: [employer] / Plan: [place] [employer] / Product Type: HIX /    In time: 8:05A Out time: 8:55A  Total Treatment Time (min): 50  Total Timed Codes (min): 35  1:1 Treatment Time ([facility] only): --   Visit #: 3     Treatment Area: Concussion [S06.0X9A]    SUBJECTIVE  Pain Level (0-10 scale): 3/10  Any medication changes, allergies to medications, adverse drug reactions, diagnosis change, or new procedure performed?:  No     Yes (see summary sheet for update)  Subjective functional status/changes:    No changes reported  "I have a little bit of a HA this morning around here (points to R temporal area) I did not do much over the weekend except for sleep a lot."    OBJECTIVE    Modality rationale: decrease pain to improve the patient???s ability to look over shoulders   Min Type Additional Details   15  Estim: Att   Unatt        TENS instruct                  IFC  Premod   NMES                     Other:  w/US   w/ice   w/heat  Position: supine  Location: neck      Traction:  Cervical       Lumbar                        Prone          Supine                       Intermittent   Continuous Lbs:   before manual   after manual  w/heat      Ultrasound: Continuous    Pulsed at:                           1MHz   3MHz Location:  W/cm2:     Paraffin         Location:   w/heat      Ice       Heat    Ice massage Position:  Location:      Laser    Other: Position:  Location:        Vasopneumatic Device Pressure:        lo  med  hi   Temperature:       Skin assessment post-treatment:  intact redness- no adverse reaction    redness ??? adverse reaction:        20 min Neuromuscular Re-education:    See flow sheet : review of current status, progression of visual tracking exercises.   Rationale: increase ROM, improve coordination, improve balance and increase proprioception  to improve the patient???s ability to restore  visual performance    15 min Manual Therapy: STM bilateral cervical paraspinal muscles, general upslide/downslide R and L, passive cervical rotation    Rationale: decrease pain, increase ROM and increase tissue extensibility to improve the patient???s ability to look over shoulders         With    TE    TA  neuro    other: Patient Education:  Review HEP     Progressed/Changed HEP based on:    positioning    body mechanics    transfers    heat/ice application     other:      Other Objective/Functional Measures: cervical AROM Rotation pre treatment R: 35 deg with P! L: 57 deg  Post treatment R: 55 deg L: 60 deg    Pain Level (0-10 scale) post treatment: 0/10    ASSESSMENT/Changes in Function:   Pt reported increase in cervical pain with gaze stabilization when turning to the R. Pt also reported increase in neck pain while performing VOR cancellation up and down. Pt reported no increase in cervical pain with either visual exercise following manual. Pt required VC on guarding during manual therapy today.   Patient will continue to benefit from skilled PT services to modify and progress therapeutic interventions, address functional mobility deficits, address ROM deficits, address strength deficits, analyze and address soft tissue restrictions, analyze and cue movement patterns, analyze and modify body mechanics/ergonomics, assess and modify postural abnormalities and address imbalance/dizziness to attain remaining goals.       See Plan of Care    See progress note/recertification    See Discharge Summary         Progress towards goals / Updated goals:  Long Term Goals: To be accomplished in 2-4 weeks:  1) Perform Gaze Stabilization x1 while walking  without symptoms  2) Perform Gaze Stabilization x2 while walking  without symptoms  3) Perform VOR Cancellation while walking without reproduction of symptoms  4) Balance on firm surface with eyes closed >/= 60 seconds without symptoms                  5) Balance on soft surface with eyes closed >/= 60 seconds without symptoms  6) Tolerate visual conflict while walking without symptoms  7) Tolerate return to physical activity protocol without symptoms  8) Pt will be able to look over shoulder to drive without pain  ??    PLAN    Upgrade activities as tolerated       Continue plan of care    Update interventions per flow sheet         Discharge due to:_    Other:_      Veneda MelterWhitney B Quante Pettry, PTA 08/16/2015  7:39 AM

## 2015-08-18 ENCOUNTER — Inpatient Hospital Stay
Admit: 2015-08-18 | Payer: PRIVATE HEALTH INSURANCE | Attending: Rehabilitative and Restorative Service Providers" | Primary: Family

## 2015-08-18 NOTE — Progress Notes (Signed)
PT DAILY TREATMENT NOTE - MCR 2-15    Patient Name: Christine York  Date:08/18/2015  DOB: [birth year]    Patient DOB Verified  Payor: [employer] / Plan: [place] [employer] / Product Type: HIX /    In time:835a  Out time:935a  Total Treatment Time (min): 60  Total Timed Codes (min): 45  1:1 Treatment Time ([facility] only): --   Visit #: 4     Treatment Area: Concussion [S06.0X9A]    SUBJECTIVE  Pain Level (0-10 scale): 4/10  Any medication changes, allergies to medications, adverse drug reactions, diagnosis change, or new procedure performed?:  No     Yes (see summary sheet for update)  Subjective functional status/changes:    No changes reported  Pt reports that she was pain free for the rest of the day after her last session but then her right sided neck pain returned when she woke the next morning.    OBJECTIVE    Modality rationale: decrease pain to improve the patient???s ability to look over shoulders   Min Type Additional Details   15  Estim: Att   Unatt        TENS instruct                  IFC  Premod   NMES                     Other:  w/US   w/ice   w/heat  Position: supine  Location: neck      Traction:  Cervical       Lumbar                        Prone          Supine                       Intermittent   Continuous Lbs:   before manual   after manual  w/heat      Ultrasound: Continuous    Pulsed at:                           1MHz   3MHz Location:  W/cm2:     Paraffin         Location:   w/heat      Ice       Heat    Ice massage Position:  Location:      Laser    Other: Position:  Location:        Vasopneumatic Device Pressure:        lo  med  hi   Temperature:       Skin assessment post-treatment:  intact redness- no adverse reaction    redness ??? adverse reaction:   15 min Neuromuscular Re-education:    See flow sheet : review of current status, progression of visual tracking exercises.   Rationale: increase ROM, improve coordination, improve balance and  increase proprioception  to improve the patient???s ability to restore visual performance  ??  30 min Manual Therapy: STM bilateral cervical paraspinal muscles, general upslide/downslide R and L, passive cervical rotation    Rationale: decrease pain, increase ROM and increase tissue extensibility to improve the patient???s ability to look over shoulders  ????  With    TE    TA    neuro    other: Patient Education:  Review HEP     Progressed/Changed HEP based on:    positioning    body mechanics    transfers    heat/ice application     other:    ??  Other Objective/Functional Measures: cervical AROM Rotation pre treatment R: 45deg with P! L: 34deg  Post treatment R: 60deg L: 60 deg  ??  Pain Level (0-10 scale) post treatment: 0/10  ??  ASSESSMENT/Changes in Function:   Focused on cervical mobility today.  Will hold on advanced visual training as she tested normal for basic level,  Gaze stabilization at 2 HZ without symptoms.  Patient will continue to benefit from skilled PT services to modify and progress therapeutic interventions, address functional mobility deficits, address ROM deficits, address strength deficits, analyze and address soft tissue restrictions, analyze and cue movement patterns, analyze and modify body mechanics/ergonomics, assess and modify postural abnormalities and address imbalance/dizziness to attain remaining goals.  ????    See Plan of Care    See progress note/recertification    See Discharge Summary  ????  Progress towards goals / Updated goals:  Long Term Goals:??To be accomplished in 2-4??weeks:  1) Perform Gaze Stabilization x1 while walking ??without symptoms  2) Perform Gaze Stabilization x2 while walking ??without symptoms  3) Perform VOR Cancellation while walking without reproduction of symptoms  4) Balance on firm surface with eyes closed >/= 60 seconds without symptoms??????????????????????????????  5) Balance on soft surface with eyes closed >/= 60 seconds without symptoms   6) Tolerate visual conflict while walking without symptoms  7) Tolerate return to physical activity protocol without symptoms  8) Pt will be able to look over shoulder to drive without pain  ????  ??  PLAN    Upgrade activities as tolerated       Continue plan of care    Update interventions per flow sheet         Discharge due to:_    Other:_      Trinda PascalMichael D Nachelle Negrette, PT, DPT 08/18/2015  7:40 AM

## 2015-08-25 ENCOUNTER — Inpatient Hospital Stay: Admit: 2015-08-25 | Payer: PRIVATE HEALTH INSURANCE | Primary: Family

## 2015-08-25 NOTE — Progress Notes (Signed)
PT DAILY TREATMENT NOTE - MCR 2-15    Patient Name: Christine York  Date:08/25/2015  DOB: 12-23-1975    Patient DOB Verified  Payor: [employer] / Plan: [place] [employer] / Product Type: HIX /    In time: 7:30A  Out time: 8:30A  Total Treatment Time (min): 60  Total Timed Codes (min): 45  1:1 Treatment Time ([facility] only): --   Visit #: 5     Treatment Area: Concussion [S06.0X9A]    SUBJECTIVE  Pain Level (0-10 scale): 3/10  Any medication changes, allergies to medications, adverse drug reactions, diagnosis change, or new procedure performed?:  No     Yes (see summary sheet for update)  Subjective functional status/changes:    No changes reported  "I felt fine after my last visit until that night. I started to get stiff. I tried to do some of the exercises which seemed to help a little but I still had an increase in pain.  OBJECTIVE    Modality rationale: decrease pain to improve the patient???s ability to look over shoulders   Min Type Additional Details   15  Estim: Att   Unatt        TENS instruct                  IFC  Premod   NMES                     Other:  w/US   w/ice   w/heat  Position: supine  Location: neck      Traction:  Cervical       Lumbar                        Prone          Supine                       Intermittent   Continuous Lbs:   before manual   after manual  w/heat      Ultrasound: Continuous    Pulsed at:                           1MHz   3MHz Location:  W/cm2:     Paraffin         Location:   w/heat      Ice       Heat    Ice massage Position:  Location:      Laser    Other: Position:  Location:        Vasopneumatic Device Pressure:        lo  med  hi   Temperature:       Skin assessment post-treatment:  intact redness- no adverse reaction    redness ??? adverse reaction:     15 min Therapeutic Exercise:   See flow sheet :   Rationale: increase ROM, increase strength and improve coordination to improve the patient???s ability to increase cervical stability with activity  ??   30 min Manual Therapy: STM bilateral cervical paraspinal muscles, general upslide/downslide R and L, passive cervical rotation    Rationale: decrease pain, increase ROM and increase tissue extensibility to improve the patient???s ability to look over shoulders  ????  With    TE    TA    neuro    other: Patient Education:  Review HEP  Progressed/Changed HEP based on:    positioning    body mechanics    transfers    heat/ice application     other:    ??  Other Objective/Functional Measures: cervical AROM Rotation pre treatment R: 50deg with P! L: 55deg  Post treatment R: 65deg L: 65deg  ??  Pain Level (0-10 scale) post treatment: 0/10  ??  ASSESSMENT/Changes in Function:   Educated pt on proper posture. Added shoulder shrugs and scap squeezes. Pt reported she was going to clean her house today. Pt advised on various ways to clean to decrease aggravation of symptoms.    Patient will continue to benefit from skilled PT services to modify and progress therapeutic interventions, address functional mobility deficits, address ROM deficits, address strength deficits, analyze and address soft tissue restrictions, analyze and cue movement patterns, analyze and modify body mechanics/ergonomics, assess and modify postural abnormalities and address imbalance/dizziness to attain remaining goals.  ????    See Plan of Care    See progress note/recertification    See Discharge Summary  ????  Progress towards goals / Updated goals:  Long Term Goals:??To be accomplished in 2-4??weeks:  1) Perform Gaze Stabilization x1 while walking ??without symptoms  2) Perform Gaze Stabilization x2 while walking ??without symptoms  3) Perform VOR Cancellation while walking without reproduction of symptoms  4) Balance on firm surface with eyes closed >/= 60 seconds without symptoms??????????????????????????????  5) Balance on soft surface with eyes closed >/= 60 seconds without symptoms  6) Tolerate visual conflict while walking without symptoms   7) Tolerate return to physical activity protocol without symptoms  8) Pt will be able to look over shoulder to drive without pain  ????  ??  PLAN    Upgrade activities as tolerated       Continue plan of care    Update interventions per flow sheet         Discharge due to:_    Other:_      Veneda MelterWhitney B Kodah Maret, PTA 08/25/2015  7:40 AM

## 2015-08-27 ENCOUNTER — Inpatient Hospital Stay: Admit: 2015-08-27 | Payer: PRIVATE HEALTH INSURANCE | Primary: Family

## 2015-08-27 NOTE — Progress Notes (Signed)
PT DAILY TREATMENT NOTE - MCR 2-15    Patient Name: Christine York  Date:08/27/2015  DOB: 1975-01-17    Patient DOB Verified  Payor: [employer] / Plan: [place] [employer] / Product Type: HIX /    In time: 7:30A  Out time: 8:15A  Total Treatment Time (min): 45  Total Timed Codes (min): 30  1:1 Treatment Time ([facility] only): --   Visit #: 6     Treatment Area: Concussion [S06.0X9A]    SUBJECTIVE  Pain Level (0-10 scale): 2/10  Any medication changes, allergies to medications, adverse drug reactions, diagnosis change, or new procedure performed?:  No     Yes (see summary sheet for update)  Subjective functional status/changes:    No changes reported  "I did not clean as much of my house as I had wanted to. I did the bathrooms. I did not have pain while I was cleaning. I did get really tight towards the end of the night but I used some heat and doing my HEP which seemed to help. I was stiff when I woke up the next morning but it was not as bad as it had been."    OBJECTIVE    Modality rationale: decrease pain to improve the patient???s ability to look over shoulders   Min Type Additional Details   15  Estim: Att   Unatt        TENS instruct                  IFC  Premod   NMES                     Other:  w/US   w/ice   w/heat  Position: supine  Location: neck      Traction:  Cervical       Lumbar                        Prone          Supine                       Intermittent   Continuous Lbs:   before manual   after manual  w/heat      Ultrasound: Continuous    Pulsed at:                             Location:  W/cm2:     Paraffin         Location:   w/heat      Ice       Heat    Ice massage Position:  Location:      Laser    Other: Position:  Location:        Vasopneumatic Device Pressure:        lo  med  hi   Temperature:       Skin assessment post-treatment:  intact redness- no adverse reaction    redness ??? adverse reaction:     10 min Therapeutic Exercise:   See flow sheet :    Rationale: increase ROM, increase strength and improve coordination to improve the patient???s ability to increase cervical stability with activity  ??  20 min Manual Therapy: STM bilateral cervical paraspinal muscles, general upslide/downslide R and L, passive cervical rotation    Rationale: decrease pain, increase ROM and increase tissue  extensibility to improve the patient???s ability to look over shoulders  ????  With    TE    TA    neuro    other: Patient Education:  Review HEP     Progressed/Changed HEP based on:    positioning    body mechanics    transfers    heat/ice application     other:      Other Objective/Functional Measures: cervical AROM Rotation pre treatment R: 63deg  L: 64deg  Post treatment R: 65deg L: 65deg  ??  Pain Level (0-10 scale) post treatment: 0/10  ??  ASSESSMENT/Changes in Function:   Pt demonstrated increase ROM pre manual today. Pt reported she did not fel as tight as she thought since she drove to [place] and back yesterday. Pt's program limited secondary to time constrain. Pt reports that she has not felt any increase in concussion symptoms lately. Patient will continue to benefit from skilled PT services to modify and progress therapeutic interventions, address functional mobility deficits, address ROM deficits, address strength deficits, analyze and address soft tissue restrictions, analyze and cue movement patterns, analyze and modify body mechanics/ergonomics, assess and modify postural abnormalities and address imbalance/dizziness to attain remaining goals.  ????    See Plan of Care    See progress note/recertification    See Discharge Summary  ????  Progress towards goals / Updated goals:  Long Term Goals:??To be accomplished in 2-4??weeks:  1) Perform Gaze Stabilization x1 while walking ??without symptoms  2) Perform Gaze Stabilization x2 while walking ??without symptoms  3) Perform VOR Cancellation while walking without reproduction of symptoms   4) Balance on firm surface with eyes closed >/= 60 seconds without symptoms??????????????????????????????  5) Balance on soft surface with eyes closed >/= 60 seconds without symptoms  6) Tolerate visual conflict while walking without symptoms  7) Tolerate return to physical activity protocol without symptoms  8) Pt will be able to look over shoulder to drive without pain  ????  ??  PLAN    Upgrade activities as tolerated       Continue plan of care    Update interventions per flow sheet         Discharge due to:_    Other:_      Veneda Melter, PTA 08/27/2015  7:40 AM

## 2015-08-30 ENCOUNTER — Inpatient Hospital Stay: Admit: 2015-08-30 | Payer: PRIVATE HEALTH INSURANCE | Primary: Family

## 2015-08-30 NOTE — Progress Notes (Signed)
PT DAILY TREATMENT NOTE - MCR 2-15    Patient Name: Christine York  Date:08/30/2015  DOB: Oct 09, 1975    Patient DOB Verified  Payor: [employer] / Plan: [place] [employer] / Product Type: HIX /    In time: 8:40A  Out time: 9:25A  Total Treatment Time (min): 45  Total Timed Codes (min): 45  1:1 Treatment Time ([place] only): --   Visit #: 7     Treatment Area: Concussion [S06.0X9A]    SUBJECTIVE  Pain Level (0-10 scale): 1/10  Any medication changes, allergies to medications, adverse drug reactions, diagnosis change, or new procedure performed?:  No     Yes (see summary sheet for update)  Subjective functional status/changes:    No changes reported  "I am only a little stiff this morning but I have not been in any pain lately. I truly feel like I'm just about at 100%."    OBJECTIVE    45 min Therapeutic Exercise:   See flow sheet :   Rationale: increase ROM, increase strength and improve coordination to improve the patient???s ability to increase cervical stability with activity    With    TE    TA    neuro    other: Patient Education:  Review HEP     Progressed/Changed HEP based on:    positioning    body mechanics    transfers    heat/ice application     other:      Other Objective/Functional Measures: cervical AROM Rotation R: 68deg  L: 68deg    ??  Pain Level (0-10 scale) post treatment: 0/10  ??  ASSESSMENT/Changes in Function:   Held on manual and estim today secondary to patient feeling better and beginning transition into home maintenance program. Added postural strengthening today. Pt tolerated added strengthening without increase in pain or discomfort.   Patient will continue to benefit from skilled PT services to modify and progress therapeutic interventions, address functional mobility deficits, address ROM deficits, address strength deficits, analyze and address soft tissue restrictions, analyze and cue movement patterns, analyze and modify body mechanics/ergonomics, assess and modify postural abnormalities and  address imbalance/dizziness to attain remaining goals.  ????    See Plan of Care    See progress note/recertification    See Discharge Summary  ????  Progress towards goals / Updated goals:  Long Term Goals:??To be accomplished in 2-4??weeks:  1) Perform Gaze Stabilization x1 while walking ??without symptoms  MET  2) Perform Gaze Stabilization x2 while walking ??without symptoms  MET  3) Perform VOR Cancellation while walking without reproduction of symptoms  MET  4) Balance on firm surface with eyes closed >/= 60 seconds without symptoms?? MET????????????????????????  5) Balance on soft surface with eyes closed >/= 60 seconds without symptoms  MET  6) Tolerate visual conflict while walking without symptoms  7) Tolerate return to physical activity protocol without symptoms   8) Pt will be able to look over shoulder to drive without pain  MET  ????  ??  PLAN    Upgrade activities as tolerated       Continue plan of care    Update interventions per flow sheet         Discharge due to:_    Other:_      Mikeal Hawthorne, PTA 08/30/2015  7:40 AM

## 2015-09-02 ENCOUNTER — Encounter

## 2015-09-03 ENCOUNTER — Inpatient Hospital Stay: Admit: 2015-09-03 | Payer: PRIVATE HEALTH INSURANCE | Primary: Family

## 2015-09-03 DIAGNOSIS — S060X9A Concussion with loss of consciousness of unspecified duration, initial encounter: Secondary | ICD-10-CM

## 2015-09-03 MED ORDER — PHENTERMINE 37.5 MG TAB
37.5 mg | ORAL_TABLET | ORAL | 0 refills | Status: DC
Start: 2015-09-03 — End: 2015-10-13

## 2015-09-03 NOTE — Progress Notes (Signed)
PT DAILY TREATMENT NOTE - MCR 2-15    Patient Name: Christine York  Date:09/03/2015  DOB: [birth year]    Patient DOB Verified  Payor: [employer] / Plan: [place] [employer] / Product Type: HIX /    In time: 8:30A  Out time: 9:15A  Total Treatment Time (min): 45  Total Timed Codes (min): 45  1:1 Treatment Time ([place] only): --   Visit #: 8     Treatment Area: Concussion [S06.0X9A]    SUBJECTIVE  Pain Level (0-10 scale): 0/10  Any medication changes, allergies to medications, adverse drug reactions, diagnosis change, or new procedure performed?:  No     Yes (see summary sheet for update)  Subjective functional status/changes:    No changes reported  "I have been feeling good."    OBJECTIVE    45 min Therapeutic Exercise:   See flow sheet :   Rationale: increase ROM, increase strength and improve coordination to improve the patient???s ability to increase cervical stability with activity    With    TE    TA    neuro    other: Patient Education:  Review HEP     Progressed/Changed HEP based on:    positioning    body mechanics    transfers    heat/ice application     other:      Other Objective/Functional Measures: --   ??  Pain Level (0-10 scale) post treatment: 0/10  ??  ASSESSMENT/Changes in Function:   Added more scapular stabilization exercises today. Pt tolerated increase without increase pain or discomfort. Pt will FU with PT next week for possible d/c.  Patient will continue to benefit from skilled PT services to modify and progress therapeutic interventions, address functional mobility deficits, address ROM deficits, address strength deficits, analyze and address soft tissue restrictions, analyze and cue movement patterns, analyze and modify body mechanics/ergonomics, assess and modify postural abnormalities and address imbalance/dizziness to attain remaining goals.  ????    See Plan of Care    See progress note/recertification    See Discharge Summary  ????  Progress towards goals / Updated goals:   Long Term Goals:??To be accomplished in 2-4??weeks:  1) Perform Gaze Stabilization x1 while walking ??without symptoms  MET  2) Perform Gaze Stabilization x2 while walking ??without symptoms  MET  3) Perform VOR Cancellation while walking without reproduction of symptoms  MET  4) Balance on firm surface with eyes closed >/= 60 seconds without symptoms?? MET????????????????????????  5) Balance on soft surface with eyes closed >/= 60 seconds without symptoms  MET  6) Tolerate visual conflict while walking without symptoms  7) Tolerate return to physical activity protocol without symptoms   8) Pt will be able to look over shoulder to drive without pain  MET  ????  PLAN    Upgrade activities as tolerated       Continue plan of care    Update interventions per flow sheet         Discharge due to:_    Other:_      Mikeal Hawthorne, PTA 09/03/2015  7:40 AM

## 2015-09-03 NOTE — Telephone Encounter (Signed)
Rx called into the pharmacy [facility] for Adipex

## 2015-09-07 ENCOUNTER — Inpatient Hospital Stay
Admit: 2015-09-07 | Payer: PRIVATE HEALTH INSURANCE | Attending: Rehabilitative and Restorative Service Providers" | Primary: Family

## 2015-09-07 NOTE — Discharge Instructions (Signed)
[facility]  [address]  [place], [place]  Phone: [phone]  Fax: [phone]    Discharge Summary  2-15    Patient name: Christine York  DOB: 1975-09-27  Provider#:[id number]  Referral source: Salena Saner, NP      Medical/Treatment Diagnosis: Concussion [S06.0X9A]     Prior Hospitalization: see medical history     Comorbidities: see evaluation  Prior Level of Function:see evaluation  Medications: Verified on Patient Summary List    Start of Care: 08/09/15      Onset Date:see evaluation   Visits from Start of Care: 9     Missed Visits: see CC  Reporting Period : 08/09/15 to 09/07/15    Summary of care:   Cervical AROM:  Flexion 50. No pain.  Extension 40. No pain.  Side Bend R 34????L 34    "sore" on R during L SBing  Rotation R 65. No pain????L 71. No pain.    Strength:  UE: Grossly WNL. No pain with UE strength testing.  LE: NT    Progress towards goals  Long Term Goals:??To be accomplished in 2-4??weeks:  1) Perform Gaze Stabilization x1 while walking ??without symptoms  MET  2) Perform Gaze Stabilization x2 while walking ??without symptoms  MET  3) Perform VOR Cancellation while walking without reproduction of symptoms  MET  4) Balance on firm surface with eyes closed >/= 60 seconds without symptoms?? MET????????????????????????  5) Balance on soft surface with eyes closed >/= 60 seconds without symptoms  MET  6) Tolerate visual conflict while walking without symptoms MET  7) Tolerate return to physical activity protocol without symptoms  MET  8) Pt will be able to look over shoulder to drive without pain  MET  ????  ASSESSMENT:??  Pt has completed 9 skilled PT visits. She has met 100% of PT goals. Pt reports 100% improvement since beginning PT for concussion and cervical pain. Pt demonstrates improvement in cervical AROM and UE strength and decreased pain levels. Pt is independent in HEP. Pt is ready for D/C to HEP.   ????  RECOMMENDATIONS:   Discontinue therapy: Patient has reached or is progressing toward set goals      Patient is non-compliant or has abdicated      Due to lack of appreciable progress towards set goals    Timoteo Gaul, PT 09/07/2015 9:16 AM

## 2015-09-07 NOTE — Progress Notes (Signed)
PT DAILY TREATMENT/Progress NOTE - MCR 2-15    Patient Name: Christine York  Date:09/07/2015  DOB: 1975/07/03    Patient DOB Verified  Payor: [employer] / Plan: [employer] / Product Type: HIX /    In time: 7:30 AM  Out time:  8:10 AM  Total Treatment Time (min): 40  Total Timed Codes (min): 40  1:1 Treatment Time (MC only): --   Visit #: 9     Treatment Area: Concussion [S06.0X9A]    SUBJECTIVE  Pain Level (0-10 scale): 0/10  Any medication changes, allergies to medications, adverse drug reactions, diagnosis change, or new procedure performed?:  No     Yes (see summary sheet for update)  Subjective functional status/changes:    No changes reported  "No issues over the weekend. I'm ready to be done with this". She states her hip is the only thing that is still bothering her. She is planning on calling Dr. Willis Modena today about her hip. "He injected it and it made it worse for about a week and a half. And then it started feeling better. Its back to hurting now though"    OBJECTIVE    40 min Therapeutic Exercise:   See flow sheet : Reassessment performed. Reviewed HEP.   Rationale: increase ROM, increase strength and improve coordination to improve the patient???s ability to increase cervical stability with activity    With    TE    TA    neuro    other: Patient Education:  Review HEP     Progressed/Changed HEP based on:    positioning    body mechanics    transfers    heat/ice application     other:      Other Objective/Functional Measures:     Cervical AROM:  Flexion 50. No pain.  Extension 40. No pain.  Side Bend R 34  L 34    "sore" on R during L SBing  Rotation R 65. No pain  L 71. No pain.    Strength:  UE: Grossly WNL. No pain with UE strength testing.  LE: NT  ??  Pain Level (0-10 scale) post treatment: 0/10  ??  ASSESSMENT/Changes in Function:   Pt has completed 9 skilled PT visits. She has met 100% of PT goals. Pt reports 100% improvement since beginning PT for concussion and cervical  pain. Pt demonstrates improvement in cervical AROM and UE strength and decreased pain levels. Pt is independent in HEP. Pt is ready for D/C to HEP.   ????    See Plan of Care    See progress note/recertification    See Discharge Summary  ????  Progress towards goals / Updated goals:  Long Term Goals:??To be accomplished in 2-4??weeks:  1) Perform Gaze Stabilization x1 while walking ??without symptoms  MET  2) Perform Gaze Stabilization x2 while walking ??without symptoms  MET  3) Perform VOR Cancellation while walking without reproduction of symptoms  MET  4) Balance on firm surface with eyes closed >/= 60 seconds without symptoms?? MET????????????????????????  5) Balance on soft surface with eyes closed >/= 60 seconds without symptoms  MET  6) Tolerate visual conflict while walking without symptoms MET  7) Tolerate return to physical activity protocol without symptoms  MET  8) Pt will be able to look over shoulder to drive without pain  MET  ????  PLAN  D/C to [place], PT, DPT 09/07/2015  7:35 AM

## 2015-09-09 ENCOUNTER — Encounter: Payer: PRIVATE HEALTH INSURANCE | Attending: Rehabilitative and Restorative Service Providers" | Primary: Family

## 2015-10-08 ENCOUNTER — Ambulatory Visit: Admit: 2015-10-08 | Attending: Family Medicine | Primary: Family

## 2015-10-08 DIAGNOSIS — M7061 Trochanteric bursitis, right hip: Secondary | ICD-10-CM

## 2015-10-08 NOTE — Progress Notes (Signed)
Chief Complaint   Patient presents with   ??? Hip Pain     R hip bursitis f/u     she is a 40 y.o. year old female who presents for follow up of injury.     Follow Up Pain Assessment Encounter      Onset of Symptoms: 4 months  ________________________________________________________________________  Description: Pain is now  is unchanged      Pain Scale:(1-10): 8  Duration:  continuous  What makes it better?: rest  What makes it worse?:exercise and walking  Medications tried: ibuprofen  Modalities tried: Injection        Reviewed and agree with Nurse Note and duplicated in this note.  Reviewed PmHx, RxHx, FmHx, SocHx, AllgHx and updated and dated in the chart.    Family History   Problem Relation Age of Onset   ??? Heart Disease Mother    ??? Heart Disease Father    ??? Heart Disease Maternal Grandmother    ??? Hypertension Maternal Grandmother    ??? Heart Disease Maternal Grandfather    ??? Hypertension Maternal Grandfather    ??? Heart Disease Paternal Grandmother    ??? Hypertension Paternal Grandmother    ??? Heart Disease Paternal Grandfather    ??? Diabetes Paternal Grandfather    ??? Hypertension Paternal Grandfather        Past Medical History:   Diagnosis Date   ??? Acid reflux    ??? Antiphospholipid syndrome (HCC)    ??? Contact dermatitis and other eczema, due to unspecified cause    ??? GERD (gastroesophageal reflux disease)    ??? Pregnancy     G5P1041; 4 SAB and 1 C/S   ??? Psoriasis       Social History     Social History   ??? Marital status: MARRIED     Spouse name: N/A   ??? Number of children: N/A   ??? Years of education: N/A     Social History Main Topics   ??? Smoking status: Never Smoker   ??? Smokeless tobacco: Never Used   ??? Alcohol use No   ??? Drug use: No   ??? Sexual activity: Yes     Partners: Male     Birth control/ protection: IUD     Other Topics Concern   ??? Not on file     Social History Narrative        Review of Systems - negative except as listed above      Objective:     Vitals:    10/08/15 1440   BP: 123/87   Pulse: 68    Resp: 18   Temp: 98 ??F (36.7 ??C)   TempSrc: Oral   SpO2: 97%   Weight: 170 lb (77.1 kg)   Height: 5\' 1"  (1.549 m)       Physical Examination: General appearance - alert, well appearing, and in no distress  Back exam - full range of motion, no tenderness, palpable spasm or pain on motion  Neurological - alert, oriented, normal speech, no focal findings or movement disorder noted  Musculoskeletal -   MSK - Hip right:    Deformity: None    ROM:     Flexion: Normal    Extension: Normal     Internal/external rotation: Normal      Gait: Normal       Palpation:    L1-L5: Negative tenderness    Sacrum: Negative tenderness    Coccyx: Negativetenderness    Left Paraspinal:  Negativetenderness    Right Paraspinal: Negativetenderness    Greater trochanter: Negativetenderness    Ischial Tuberosity: Negativetenderness    Piriformis: Negativetenderness       Strength (0-5/5)    Hip Flexion:  Left: 5/5  Right: 5/5    Hip Extension: Left: 5/5  Right: 5/5    Hip Abduction: Left: 5/5  Right: 5/5    Hip Adduction: Left: 5/5  Right: 5/5    Knee Extension: Left: 5/5  Right: 5/5    Knee Flexion:  Left: 5/5  Right: 5/5    One leg squat:       Sensation: Intact, no deficits      DTR:    Patella:2       Achilles: 2   Special test:    Straight ZOX:[id number]     Pearlean Brownie???s:Negative     Piriformis:Negative     FADIR is Negative      Extremities - peripheral pulses normal, no pedal edema, no clubbing or cyanosis  Skin - normal coloration and turgor, no rashes, no suspicious skin lesions noted    Assessment/ Plan:   Diagnoses and all orders for this visit:    1. Greater trochanteric bursitis, right  -     MRI HIP  RT  WO CONT; Future    2. Right hip pain  -     MRI HIP  RT  WO CONT; Future     MRI to rule out gluteus medius tear  Follow-up Disposition:  Return in about 2 weeks (around 10/22/2015) for mri follow up.    Pathophysiology, recovery and rehabilitation process discussed and questions answered    Counseling for 30 Minutes of the total visit duration   Pictures and figures used as necessary   Provided reassurance   Phone f/u for MRI results  Recommend  lower impact activities-walking, Eliptical, Nordic Track, cycling or swimming       I have discussed the diagnosis with the patient and the intended plan as seen in the above orders.  The patient has received an after-visit summary and questions were answered concerning future plans.     Medication Side Effects and Warnings were discussed with patient: yes  Patient Labs were reviewed and or requested: yes  Patient Past Records were reviewed and or requested  yes  I have discussed the diagnosis with the patient and the intended plan as seen in the above orders.  The patient has received an after-visit summary and questions were answered concerning future plans.     Pt agrees to call or return to clinic and/or go to closest ER with any worsening of symptoms.  This may include, but not limited to increased fever (>100.4) with NSAIDS or Tylenol, increased edema, confusion, rash, worsening of presenting symptoms.    Patient was informed/counseled to:    1) Remember to stay active and/or exercise regularly (I suggest 30-45 minutes daily)   2) For reliable dietary information, go to www.[employer].fi. You may wish to consider seeing the nutritionist at [facility] at [phone] or [phone], also consider the Mediterranean diet.  3) I routinely suggest a complete physical exam once each year (your birth month)

## 2015-10-12 ENCOUNTER — Encounter

## 2015-10-12 NOTE — Telephone Encounter (Signed)
-----   Message from Bonne DoloresShanea L Wright sent at 10/12/2015  8:33 AM EDT -----  Regarding: Dr. Princess PernaSanderforf/Refill  Pt called concerning her medication she stated that she wants the prescription to be sent to the pharmacy that it is on file. Pt best contact number [phone]

## 2015-10-12 NOTE — Telephone Encounter (Signed)
Needs office visit

## 2015-10-12 NOTE — Telephone Encounter (Signed)
Request refill on phentermine   Last refill 09/03/15

## 2015-10-12 NOTE — Telephone Encounter (Signed)
Left message she needs office visit before refilling medication.

## 2015-10-13 ENCOUNTER — Ambulatory Visit: Admit: 2015-10-13 | Discharge: 2015-10-13 | Attending: Family | Primary: Family

## 2015-10-13 DIAGNOSIS — E663 Overweight: Secondary | ICD-10-CM

## 2015-10-13 MED ORDER — FLUCONAZOLE 150 MG TAB
150 mg | ORAL_TABLET | Freq: Every day | ORAL | 0 refills | Status: AC
Start: 2015-10-13 — End: 2015-10-15

## 2015-10-13 MED ORDER — PHENTERMINE 37.5 MG CAP
37.5 mg | ORAL_CAPSULE | ORAL | 0 refills | Status: DC
Start: 2015-10-13 — End: 2015-11-24

## 2015-10-13 MED ORDER — PHENTERMINE 37.5 MG TAB
37.5 mg | ORAL_TABLET | ORAL | 0 refills | Status: DC
Start: 2015-10-13 — End: 2015-10-13
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
## 2015-10-13 NOTE — Progress Notes (Signed)
1. Have you been to the ER, urgent care clinic since your last visit?  Hospitalized since your last visit?No    2. Have you seen or consulted any other health care providers outside of the [facility] since your last visit?  Include any pap smears or colon screening. No     Chief Complaint   Patient presents with   ??? Medication Refill     patient needs refill on phentermine     Learning assessment complete    Abuse Screening Questionnaire 10/13/2015   Do you ever feel afraid of your partner? N   Are you in a relationship with someone who physically or mentally threatens you? N   Is it safe for you to go home? Y     Fall Risk Assessment, last 12 mths 10/13/2015   Able to walk? Yes

## 2015-10-13 NOTE — Telephone Encounter (Signed)
New rx called in

## 2015-10-13 NOTE — Telephone Encounter (Signed)
Suanne MarkerJennifer- [facility]    [phone]-     [phone]        Phentermine need to be switched to capsules-  Patient is @ pharmacy

## 2015-10-18 ENCOUNTER — Inpatient Hospital Stay: Admit: 2015-10-18 | Payer: PRIVATE HEALTH INSURANCE | Attending: Family Medicine | Primary: Family

## 2015-10-18 ENCOUNTER — Ambulatory Visit

## 2015-10-18 DIAGNOSIS — M7061 Trochanteric bursitis, right hip: Secondary | ICD-10-CM

## 2015-10-18 NOTE — Progress Notes (Signed)
Please inform normal hip MRI except for trochanteric bursitis.  Therapy and/or injection are the modalities to treat this.   rb

## 2015-10-19 ENCOUNTER — Encounter

## 2015-10-19 NOTE — Progress Notes (Signed)
Called and spoke with patient after verifying name and DOB. Notified patient of lab results and recommendations from provider. Patient was given a chance to ask questions and verbalized an understanding of the information.   Patient states she would not like to do any injections as she feels she did not get any relief from the last one she had. Patient would like to go to PT downstairs.

## 2015-11-24 MED ORDER — PHENTERMINE 37.5 MG CAP
37.5 mg | ORAL_CAPSULE | ORAL | 0 refills | Status: AC
Start: 2015-11-24 — End: ?

## 2015-11-24 NOTE — Telephone Encounter (Signed)
Patient called to get a refill. Pharmacy on file verified.  .  Requested Prescriptions     Pending Prescriptions Disp Refills   ??? phentermine 37.5 mg capsule 30 Cap 0     Sig: Take 37.5 mg by mouth every morning. Max Daily Amount: 37.5 mg.

## 2015-11-24 NOTE — Telephone Encounter (Signed)
Last visit 10/13/15

## 2016-01-04 NOTE — Telephone Encounter (Signed)
Needs office visit

## 2016-01-04 NOTE — Telephone Encounter (Signed)
Last visit 10/13/15

## 2016-01-04 NOTE — Telephone Encounter (Signed)
Transferred up front to schedule an appointment

## 2016-01-04 NOTE — Telephone Encounter (Signed)
-----   Message from Lonn GeorgiaPier D Shelton sent at 01/04/2016  9:00 AM EST -----  Regarding: NP [place]/Refill  Patient is [facility] a [facility] [phone]. The patient's number is [phone].

## 2016-01-10 ENCOUNTER — Encounter: Attending: Family | Primary: Family

## 2016-02-13 ENCOUNTER — Encounter

## 2016-02-14 MED ORDER — FLUOCINONIDE 0.05 % OINTMENT
0.05 % | CUTANEOUS | 2 refills | Status: DC
Start: 2016-02-14 — End: 2016-08-07

## 2016-08-07 ENCOUNTER — Encounter

## 2016-08-07 MED ORDER — FLUOCINONIDE 0.05 % OINTMENT
0.05 % | CUTANEOUS | 0 refills | Status: AC
Start: 2016-08-07 — End: ?
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
## 2022-09-21 NOTE — Telephone Encounter (Signed)
 Pt called to find out if we received their referral and office notes, and I informed her that we have not yet. I advised her to reach out to her pcp's office and ask them to fax that referral and office notes again and I provided her with our fax number too.

## 2022-09-22 NOTE — Telephone Encounter (Signed)
 Pt called again to find out if the physician had had a chance to look over her referral and I informed her that she had not yet. I advised her that we will contact her as soon as she looks over it and gives us  a go head. Pt verbally understood the information I gave her.

## 2022-09-22 NOTE — Telephone Encounter (Signed)
PT called to f/up on referral. Informed that the NP still had to review it and the office would call back once it has been. PT stated she understood

## 2022-09-25 NOTE — Telephone Encounter (Signed)
 PT called to f/up on referral. Informed that due to her insurance being Y[employer] we wouldn't be able to take her. PT stated she understood

## 2023-12-11 NOTE — Progress Notes (Signed)
"  METS>4  "

## 2023-12-11 NOTE — Periop Note (Signed)
"  PAT call to patient. Pt reports Right, posted as left.  LM at office requesting orders and posting update  "

## 2023-12-11 NOTE — Periop Note (Signed)
 "  [facility]  Ambulatory Surgery Unit  [phone]  Pre-operative Instructions    Surgery/Procedure Date  12/11            Tentative Arrival Time TBD      1. On the day of your surgery/procedure, please report to the Ambulatory Surgery Unit Registration Desk and sign in at your designated time. The Ambulatory Surgery Unit is located in [facility] on the [place] side of the hospital across from the [facility]  building. Please have all of your health insurance cards, co-payment, and a photo ID.    **TWO adults may accompany you the day of the procedure.  We have limited seating available.      2. You cannot be dropped off for surgery.  Please make arrangements for a responsible adult friend or family member to remain on the hospital campus during your procedure, and drive you home, as you should not drive for 24 hours following anesthesia. Make arrangements for a responsible adult to stay with you for at least the first 24 hours after your surgery.    3. Do not have anything to eat or drink (including water, gum, mints, coffee, juice) after 11:59 PM  12/10. This may not apply to medications prescribed by your physician.  (Please note below the special instructions with medications to take the morning of surgery, if applicable.)    4. We recommend you do not drink any alcoholic beverages for 24 hours before and after your surgery.    5. Contact your surgeons office for instructions on the following medications: non-steroidal anti-inflammatory drugs (i.e. Advil, Aleve), vitamins, and supplements. (Some surgeons will want you to stop these medications prior to surgery and others may allow you to take them)   **If you are currently taking Plavix, Coumadin, Aspirin and/or other blood-thinning agents, contact your surgeon for instructions.** Your surgeon will partner with the physician prescribing these medications to determine if it is safe to stop or if you need to continue taking. Please do  not stop taking these medications without instructions from your surgeon.    6. Wear comfortable clothes. Wear glasses instead of contacts. Do not bring any jewelry or money (other than copays or fees as instructed). Do not wear make-up, particularly mascara, the morning of your surgery. Do not wear nail polish, particularly if you are having foot /hand surgery. Wear your hair loose or down, no ponytails, buns, bobby pins or clips. All body piercings must be removed.  Please see the attached Soap/Hibiclens bathing instructions.    7. You should understand that if you do not follow these instructions your surgery may be cancelled. If your physical condition changes (i.e. fever, cold or flu) please contact your surgeon as soon as possible.    8. It is important that you be on time. If a situation occurs where you may be late, or if you have any questions or problems, please call [phone].    9. Your surgery time may be subject to change. You will receive a phone call the day prior to surgery to confirm your arrival time.      Special Instructions:    Take all medications and inhalers, as prescribed, on the morning of surgery with a sip of water       I understand a pre-operative phone call will be made to verify my surgery time.  In the event that I am not available, I give permission for a message to be left on my  answering service and/or with another person?      yes    Hibiclens/Chlorhexidine    Preventing Infections Before and After - Your Surgery    IMPORTANT INSTRUCTIONS    Please read and follow these instructions carefully. If you are unable to comply with the below instructions your procedure will be cancelled.       Every Night for Three (3) nights before your surgery:  Shower with an antibacterial soap, such as Dial, or the soap provided at your preassessment appointment. A shower is better than a bath for cleaning your skin.  If needed, ask someone to help you reach all areas of your body. Dont  forget to clean your belly button with every shower.    The night before your surgery:   If you lose your Hibiclens/chlorhexidine please contact surgery center or you can purchase it at a local pharmacy  On the night before your surgery, shower with an antibacterial soap, such as Dial, or the soap provided at your preassessment appointment.   With bottle of Hibiclens/Chlorhexidine in hand, turn water off.  Apply Hibiclens antiseptic skin cleanser with a clean, freshly washed washcloth.  Gently apply to your body from chin to toes (except the genital area) and especially the area(s) where your incision(s) will be.  Leave Hibiclens/Chlorhexidine on your skin for at least 20 seconds.    CAUTION: If needed, Hibiclens/chlorhexidine may be used to clean the folds of skin of the legs (such as in the area of the groin) and on your buttocks and hips. However, do not use Hibiclens/Chlorhexidine above the neck or in the genital area (your bottom) or put inside any area of your body.  Turn the water back on and rinse.  Dry gently with a clean, freshly washed towel.  After your shower, do not use any powder, deodorant, perfumes or lotion.  Use clean, freshly washed towels and washcloths every time you shower.  Wear clean, freshly washed pajamas to bed the night before surgery.  Sleep on clean, freshly washed sheets.  Do not allow pets to sleep in your bed with you.        The Morning of your surgery:  Shower again thoroughly with an antibacterial soap, such as Dial or the soap provided at your preassessment appointment. If needed, ask someone for help to reach all areas of your body. Dont forget to clean your belly button! Rinse.  With bottle of Hibiclens/Chlorhexidine in hand, turn water off.  Apply Hibiclens antiseptic skin cleanser with a clean, freshly washed washcloth.  Gently apply to your body from chin to toes (except the genital area) and especially the area(s) where your incision(s) will be.  Leave  Hibiclens/Chlorhexidine on your skin for at least 20 seconds.     CAUTION: If needed, Hibiclens/chlorhexidine may be used to clean the folds of skin of the legs (such as in the area of the groin) and on your buttocks and hips. However, do not use Hibiclens/Chlorhexidine above the neck or in the genital area (your bottom) or put inside any area of your body.  Turn the water back on and rinse.  Dry gently with a clean, freshly washed towel.  After your shower, do not use any powder, deodorant, perfumes or lotion prior to surgery.  Put on clean, freshly washed clothing.    Tips to help prevent infections after your surgery:  Protect your surgical wound from germs:  Hand washing is the most important thing you and your caregivers can do  to prevent infections.  Keep your bandage clean and dry!  Do not touch your surgical wound.  Use clean, freshly washed towels and washcloths every time you shower; do not share bath linens with others.  Until your surgical wound is healed, wear clothing and sleep on bed linens each day that are clean and freshly washed.  Do not allow pets to sleep in your bed with you or touch your surgical wound.  Do not smoke - smoking delays wound healing. This may be a good time to stop smoking.  If you have diabetes, it is important for you to manage your blood sugar levels properly before your surgery as well as after your surgery. Poorly managed blood sugar levels slow down wound healing and prevent you from healing completely.    If you lose your Hibiclens/chlorhexidine, please call the Surgery Center, or it is available for purchase at your pharmacy.        Reviewed by phone with pt. Verbalized understanding. Also sent via mychart message       ___________________      ___________________      ________________  (Signature of Patient)          (Witness)                   (Date and Time)  "
